# Patient Record
Sex: Female | Born: 1945 | Smoking: Former smoker
Health system: Southern US, Community
[De-identification: ages and names within clinical notes are randomized; demographics above are authoritative.]

## PROBLEM LIST (undated history)

## (undated) DIAGNOSIS — E162 Hypoglycemia, unspecified: Secondary | ICD-10-CM

## (undated) DIAGNOSIS — F419 Anxiety disorder, unspecified: Secondary | ICD-10-CM

## (undated) DIAGNOSIS — T753XXA Motion sickness, initial encounter: Secondary | ICD-10-CM

## (undated) DIAGNOSIS — K219 Gastro-esophageal reflux disease without esophagitis: Secondary | ICD-10-CM

## (undated) DIAGNOSIS — R011 Cardiac murmur, unspecified: Secondary | ICD-10-CM

## (undated) DIAGNOSIS — E119 Type 2 diabetes mellitus without complications: Secondary | ICD-10-CM

## (undated) DIAGNOSIS — I10 Essential (primary) hypertension: Secondary | ICD-10-CM

## (undated) HISTORY — PX: TUBAL LIGATION: SHX77

---

## 2015-11-13 ENCOUNTER — Emergency Department
Admission: EM | Admit: 2015-11-13 | Discharge: 2015-11-13 | Disposition: A | Discharge status: Home / Self Care | Payer: Medicare Other | Attending: Emergency Medicine | Admitting: Emergency Medicine

## 2015-11-13 ENCOUNTER — Encounter: Payer: Self-pay | Admitting: *Deleted

## 2015-11-13 DIAGNOSIS — E876 Hypokalemia: Secondary | ICD-10-CM | POA: Diagnosis not present

## 2015-11-13 DIAGNOSIS — Z87891 Personal history of nicotine dependence: Secondary | ICD-10-CM | POA: Insufficient documentation

## 2015-11-13 DIAGNOSIS — R42 Dizziness and giddiness: Secondary | ICD-10-CM | POA: Diagnosis present

## 2015-11-13 DIAGNOSIS — R002 Palpitations: Secondary | ICD-10-CM

## 2015-11-13 HISTORY — DX: Hypoglycemia, unspecified: E16.2

## 2015-11-13 LAB — COMPREHENSIVE METABOLIC PANEL
ALBUMIN: 3.7 g/dL (ref 3.5–5.0)
ALT: 14 U/L (ref 14–54)
AST: 20 U/L (ref 15–41)
Alkaline Phosphatase: 78 U/L (ref 38–126)
Anion gap: 5 (ref 5–15)
BUN: 11 mg/dL (ref 6–20)
CHLORIDE: 106 mmol/L (ref 101–111)
CO2: 28 mmol/L (ref 22–32)
Calcium: 8.6 mg/dL — ABNORMAL LOW (ref 8.9–10.3)
Creatinine, Ser: 0.57 mg/dL (ref 0.44–1.00)
GFR calc Af Amer: >60 mL/min (ref >60)
GFR calc non Af Amer: >60 mL/min (ref >60)
GLUCOSE: 151 mg/dL — AB (ref 65–99)
POTASSIUM: 3.2 mmol/L — AB (ref 3.5–5.1)
Sodium: 139 mmol/L (ref 135–145)
Total Bilirubin: 0.5 mg/dL (ref 0.3–1.2)
Total Protein: 6.4 g/dL — ABNORMAL LOW (ref 6.5–8.1)

## 2015-11-13 LAB — CBC WITH DIFFERENTIAL/PLATELET
Basophils Absolute: 0.1 10*3/uL (ref 0–0.1)
Basophils Relative: 1 %
EOS PCT: 2 %
Eosinophils Absolute: 0.2 10*3/uL (ref 0–0.7)
HEMATOCRIT: 35.5 % (ref 35.0–47.0)
Hemoglobin: 12 g/dL (ref 12.0–16.0)
LYMPHS ABS: 1 10*3/uL (ref 1.0–3.6)
LYMPHS PCT: 12 %
MCH: 30.1 pg (ref 26.0–34.0)
MCHC: 33.9 g/dL (ref 32.0–36.0)
MCV: 88.7 fL (ref 80.0–100.0)
MONO ABS: 0.7 10*3/uL (ref 0.2–0.9)
MONOS PCT: 8 %
Neutro Abs: 6 10*3/uL (ref 1.4–6.5)
Neutrophils Relative %: 77 %
PLATELETS: 235 10*3/uL (ref 150–440)
RBC: 4.01 MIL/uL (ref 3.80–5.20)
RDW: 13.2 % (ref 11.5–14.5)
WBC: 7.9 10*3/uL (ref 3.6–11.0)

## 2015-11-13 LAB — URINALYSIS COMPLETE WITH MICROSCOPIC (ARMC ONLY)
BILIRUBIN URINE: NEGATIVE
Glucose, UA: 50 mg/dL — AB
Ketones, ur: NEGATIVE mg/dL
NITRITE: NEGATIVE
PH: 6 (ref 5.0–8.0)
Protein, ur: NEGATIVE mg/dL
SPECIFIC GRAVITY, URINE: 1.01 (ref 1.005–1.030)

## 2015-11-13 LAB — TROPONIN I

## 2015-11-13 MED ORDER — POTASSIUM CHLORIDE CRYS ER 20 MEQ PO TBCR
20.0000 meq | EXTENDED_RELEASE_TABLET | Freq: Once | ORAL | Status: AC
  Administered 2015-11-13: 20 meq via ORAL
  Filled 2015-11-13: qty 1

## 2015-11-13 NOTE — Discharge Instructions (Signed)
Your blood tests overall look good, though your potassium level was only a little bit low at 3.2. We agree that you would try to eat more potassium rich foods. You're given one pill of potassium in the emergency department. Follow-up at [facility] as you have planned. Return to the emergency department if you have weakness, chest pain, or other urgent concerns.  Palpitations A palpitation is the feeling that your heartbeat is irregular. It may feel like your heart is fluttering or skipping a beat. It may also feel like your heart is beating faster than normal. This is usually not a serious problem. In some cases, you may need more medical tests. HOME CARE  Avoid:  Caffeine in coffee, tea, soft drinks, diet pills, and energy drinks.  Chocolate.  Alcohol.  Stop smoking if you smoke.  Reduce your stress and anxiety. Try:  A method that measures bodily functions so you can learn to control them (biofeedback).  Yoga.  Meditation.  Physical activity such as swimming, jogging, or walking.  Get plenty of rest and sleep. GET HELP IF:  Your fast or irregular heartbeat continues after 24 hours.  Your palpitations occur more often. GET HELP RIGHT AWAY IF:   You have chest pain.  You feel short of breath.  You have a very bad headache.  You feel dizzy or pass out (faint). MAKE SURE YOU:   Understand these instructions.  Will watch your condition.  Will get help right away if you are not doing well or get worse.   This information is not intended to replace advice given to you by your health care provider. Make sure you discuss any questions you have with your health care provider.   Document Released: 09/21/2008 Document Revised: 01/03/2015 Document Reviewed: 02/11/2012 Elsevier Interactive Patient Education [employer].

## 2015-11-13 NOTE — ED Provider Notes (Signed)
[facility] Emergency Department Provider Note  ____________________________________________  Time seen: 1125  I have reviewed the triage vital signs and the nursing notes.   HISTORY  Chief Complaint Dizziness  palpitations    HPI Alicia Jennings is a 69 y.o. female who experienced some palpitations yesterday and today. She describes this as having "skipped a beat". When this happens, she feels a little bit lightheaded and has some tingling through her body. She has had symptoms like this before and was told it was due to hypoglycemia. She was advised to eat peanut butter when this occurs. She ate peanut butter after some of the symptoms yesterday and it seemed to improve and again today. She is currently experiencing minimal symptoms, but does report that she still feels it occasionally. She reports that she has had a total of approximately 10 skipped beats through this morning.  She denies any chest pain, shortness of breath, or fever.   Past Medical History  Diagnosis Date  . Hypoglycemia     There are no active problems to display for this patient.   History reviewed. No pertinent past surgical history.  Current Outpatient Rx  Name  Route  Sig  Dispense  Refill  . naproxen sodium (ANAPROX) 220 MG tablet   Oral   Take 220 mg by mouth as needed.         . RaNITidine HCl (ACID REDUCER PO)   Oral   Take 1 tablet by mouth as needed.           Allergies Aspirin  No family history on file.  Social History Social History  Substance Use Topics  . Smoking status: Former Research scientist (life sciences)  . Smokeless tobacco: None  . Alcohol Use: Yes     Comment: occasional    Review of Systems  Constitutional: Negative for fatigue. ENT: Negative for congestion. Cardiovascular: "Skipped beats". See history of present illness. Respiratory: Negative for cough. Gastrointestinal: Negative for abdominal pain, vomiting and diarrhea. Genitourinary: Negative for  dysuria. Musculoskeletal: No myalgias or injuries. Skin: Negative for rash. Neurological: Negative for headache or focal weakness   10-point ROS otherwise negative.  ____________________________________________   PHYSICAL EXAM:  VITAL SIGNS: ED Triage Vitals  Enc Vitals Group     BP 11/13/15 1119 168/91 mmHg     Pulse Rate 11/13/15 1119 78     Resp 11/13/15 1119 18     Temp 11/13/15 1119 98 F (36.7 C)     Temp Source 11/13/15 1119 Oral     SpO2 11/13/15 1119 100 %     Weight 11/13/15 1119 135 lb (61.236 kg)     Height 11/13/15 1119 5' 1.5" (1.562 m)     Head Cir --      Peak Flow --      Pain Score --      Pain Loc --      Pain Edu? --      Excl. in [place]? --     Constitutional: Alert and oriented. Well appearing and in no distress. ENT   Head: Normocephalic and atraumatic.   Nose: No congestion/rhinnorhea.       Mouth: No erythema, no swelling   Cardiovascular: Normal rate, regular rhythm, no murmur noted Respiratory:  Normal respiratory effort, no tachypnea.    Breath sounds are clear and equal bilaterally.  Gastrointestinal: Soft and nontender. No distention.  Back: No muscle spasm, no tenderness, no CVA tenderness. Musculoskeletal: No deformity noted. Nontender with normal range of motion in  all extremities.  No noted edema. Neurologic:  Communicative. Normal appearing spontaneous movement in all 4 extremities. No gross focal neurologic deficits are appreciated.  Skin:  Skin is warm, dry. No rash noted. Psychiatric: Mood and affect are normal. Speech and behavior are normal.  ____________________________________________    LABS (pertinent positives/negatives)  Labs Reviewed  COMPREHENSIVE METABOLIC PANEL - Abnormal; Notable for the following:    Potassium 3.2 (*)    Glucose, Bld 151 (*)    Calcium 8.6 (*)    Total Protein 6.4 (*)    All other components within normal limits  URINALYSIS COMPLETEWITH MICROSCOPIC ([facility] ONLY) - Abnormal; Notable for the  following:    Color, Urine STRAW (*)    APPearance CLEAR (*)    Glucose, UA 50 (*)    Hgb urine dipstick 1+ (*)    Leukocytes, UA 1+ (*)    Bacteria, UA RARE (*)    Squamous Epithelial / LPF 0-5 (*)    All other components within normal limits  TROPONIN I  CBC WITH DIFFERENTIAL/PLATELET     ____________________________________________   EKG  ED ECG REPORT I, Zara Wendt W, the attending physician, personally viewed and interpreted this ECG.   Date: 11/13/2015  EKG Time: 1153  Rate: 76  Rhythm: Normal sinus rhythm  Axis: Normal  Intervals: Normal  ST&T Change: None noted   ____________________________________________    INITIAL IMPRESSION / ASSESSMENT AND PLAN / ED COURSE  Pertinent labs & imaging results that were available during my care of the patient were reviewed by me and considered in my medical decision making (see chart for details).  Well-appearing 69 year old female with a history of "skipped beats".  EKG looks good with no premature ventricular contractions area labs are pending.  ----------------------------------------- 3:04 PM on 11/13/2015 -----------------------------------------  Patient's labs have returned. Her potassium is 3.2. It is possible that this very slight decrease from normal could be triggering some of her symptoms. We will treat her with potassium, 20 mEq, by mouth now. I have offered the patient a prescription of potassium versus taking potassium rich foods. She prefers to eat potassium rich foods. I think this is reasonable.  We have discussed situation and follow-up with her as well as her daughter. She plans on following up at [place]. I think this is a good follow-up plan, as Dr. Stark Klein can assist her with a Holter monitor if he feels it is indicated. Currently I do not see such an indication.  Patient looks and feels well.  ____________________________________________   FINAL CLINICAL IMPRESSION(S) / ED  DIAGNOSES  Final diagnoses:  Heart palpitations  Hypokalemia      Ahmed Prima, MD 11/13/15 [phone]

## 2015-11-13 NOTE — ED Notes (Signed)
Patient states she felt dizzy, short of breath and felt her heart would skip a beat at work. Patient has a history of hypoglycemia and told EMS that she felt like she had a low blood sugar and ate peanut butter. Patient states the peanut butter did not help her symptoms.

## 2015-11-19 ENCOUNTER — Encounter: Payer: Self-pay | Admitting: Emergency Medicine

## 2015-11-19 ENCOUNTER — Emergency Department: Payer: Medicare Other

## 2015-11-19 ENCOUNTER — Emergency Department
Admission: EM | Admit: 2015-11-19 | Discharge: 2015-11-19 | Disposition: A | Discharge status: Home / Self Care | Payer: Medicare Other | Attending: Emergency Medicine | Admitting: Emergency Medicine

## 2015-11-19 DIAGNOSIS — Z87891 Personal history of nicotine dependence: Secondary | ICD-10-CM | POA: Insufficient documentation

## 2015-11-19 DIAGNOSIS — E119 Type 2 diabetes mellitus without complications: Secondary | ICD-10-CM | POA: Insufficient documentation

## 2015-11-19 DIAGNOSIS — I10 Essential (primary) hypertension: Secondary | ICD-10-CM | POA: Diagnosis not present

## 2015-11-19 DIAGNOSIS — F419 Anxiety disorder, unspecified: Secondary | ICD-10-CM | POA: Insufficient documentation

## 2015-11-19 DIAGNOSIS — R252 Cramp and spasm: Secondary | ICD-10-CM | POA: Insufficient documentation

## 2015-11-19 DIAGNOSIS — R0602 Shortness of breath: Secondary | ICD-10-CM | POA: Insufficient documentation

## 2015-11-19 HISTORY — DX: Type 2 diabetes mellitus without complications: E11.9

## 2015-11-19 HISTORY — DX: Essential (primary) hypertension: I10

## 2015-11-19 LAB — COMPREHENSIVE METABOLIC PANEL
ALBUMIN: 4.3 g/dL (ref 3.5–5.0)
ALK PHOS: 84 U/L (ref 38–126)
ALT: 14 U/L (ref 14–54)
AST: 22 U/L (ref 15–41)
Anion gap: 9 (ref 5–15)
BILIRUBIN TOTAL: 0.5 mg/dL (ref 0.3–1.2)
BUN: 26 mg/dL — AB (ref 6–20)
CALCIUM: 9.4 mg/dL (ref 8.9–10.3)
CO2: 28 mmol/L (ref 22–32)
CREATININE: 0.79 mg/dL (ref 0.44–1.00)
Chloride: 94 mmol/L — ABNORMAL LOW (ref 101–111)
GFR calc Af Amer: >60 mL/min (ref >60)
GFR calc non Af Amer: >60 mL/min (ref >60)
GLUCOSE: 199 mg/dL — AB (ref 65–99)
Potassium: 4.5 mmol/L (ref 3.5–5.1)
SODIUM: 131 mmol/L — AB (ref 135–145)
TOTAL PROTEIN: 8 g/dL (ref 6.5–8.1)

## 2015-11-19 LAB — FIBRIN DERIVATIVES D-DIMER (ARMC ONLY): Fibrin derivatives D-dimer (ARMC): 693 — ABNORMAL HIGH (ref 0–499)

## 2015-11-19 LAB — CBC WITH DIFFERENTIAL/PLATELET
BASOS PCT: 1 %
Basophils Absolute: 0.1 10*3/uL (ref 0–0.1)
EOS ABS: 0.1 10*3/uL (ref 0–0.7)
Eosinophils Relative: 1 %
HEMATOCRIT: 39.8 % (ref 35.0–47.0)
HEMOGLOBIN: 13.6 g/dL (ref 12.0–16.0)
LYMPHS ABS: 2 10*3/uL (ref 1.0–3.6)
Lymphocytes Relative: 25 %
MCH: 29.9 pg (ref 26.0–34.0)
MCHC: 34.1 g/dL (ref 32.0–36.0)
MCV: 87.7 fL (ref 80.0–100.0)
MONOS PCT: 10 %
Monocytes Absolute: 0.8 10*3/uL (ref 0.2–0.9)
NEUTROS ABS: 5.2 10*3/uL (ref 1.4–6.5)
NEUTROS PCT: 63 %
Platelets: 314 10*3/uL (ref 150–440)
RBC: 4.53 MIL/uL (ref 3.80–5.20)
RDW: 13 % (ref 11.5–14.5)
WBC: 8.3 10*3/uL (ref 3.6–11.0)

## 2015-11-19 LAB — TROPONIN I: Troponin I: <0.03 ng/mL (ref <0.031)

## 2015-11-19 MED ORDER — SODIUM CHLORIDE 0.9 % IV BOLUS (SEPSIS)
500.0000 mL | Freq: Once | INTRAVENOUS | Status: AC
  Administered 2015-11-19: 500 mL via INTRAVENOUS

## 2015-11-19 MED ORDER — IOHEXOL 350 MG/ML SOLN
100.0000 mL | Freq: Once | INTRAVENOUS | Status: AC | PRN
Start: 2015-11-19 — End: 2015-11-19
  Administered 2015-11-19: 100 mL via INTRAVENOUS

## 2015-11-19 MED ORDER — LORAZEPAM 2 MG/ML IJ SOLN
1.0000 mg | Freq: Once | INTRAMUSCULAR | Status: AC
  Administered 2015-11-19: 1 mg via INTRAVENOUS

## 2015-11-19 MED ORDER — LORAZEPAM 1 MG PO TABS: 1.0000 mg | ORAL_TABLET | Freq: Three times a day (TID) | ORAL | Status: AC | PRN

## 2015-11-19 NOTE — ED Notes (Signed)
Pt reports that she has cramping in her hands, feet, and calves that started today and that is why she called EMS. She states that when she tries to walk, cramps get worse. SOB comes and goes. Pt was seen by PCP and heart doc yesterday. She is scheduled for stress test and echocardiogram on Dec 1. She began taking lisinopril on Sunday after going to [facility] because her BP was high ("170 something").

## 2015-11-19 NOTE — Discharge Instructions (Signed)

## 2015-11-19 NOTE — ED Notes (Signed)
Pt states "i feel sleepy, is it normal for me to feel sleepy?" explanation of ativan provided to pt. Pt verbalizes understanding. Pt denies pain currently. Call bell provided to pt. resps unlabored.

## 2015-11-19 NOTE — ED Provider Notes (Signed)
Time Seen: Approximately 1940 I have reviewed the triage notes  Chief Complaint: Shortness of Breath   History of Present Illness: Alicia Jennings is a 69 y.o. female who has been recently evaluated for her shortness of breath and some other nonspecific complaints. Patient tonight presents with some cramping in her hands, feet, calves. Patient was transported by EMS and also states that she does still have some shortness of breath that "" comes and goes "". She denies any chest pain or calf swelling. She states she was breathing fast and felt anxious when the symptoms occurred. She describes diffuse cramping in both upper and lower extremities. She denies any fever or chills or cough. She states that the cramping in her upper extremity has improved though she still has some mild cramping in both lower extremities.   Past Medical History  Diagnosis Date  . Hypoglycemia   . Hypertension   . Diabetes mellitus without complication ([place])     There are no active problems to display for this patient.   History reviewed. No pertinent past surgical history.  History reviewed. No pertinent past surgical history.  Current Outpatient Rx  Name  Route  Sig  Dispense  Refill  . LORazepam (ATIVAN) 1 MG tablet   Oral   Take 1 tablet (1 mg total) by mouth every 8 (eight) hours as needed for anxiety.   21 tablet   0   . naproxen sodium (ANAPROX) 220 MG tablet   Oral   Take 220 mg by mouth as needed.         . RaNITidine HCl (ACID REDUCER PO)   Oral   Take 1 tablet by mouth as needed.           Allergies:  Aspirin  Family History: History reviewed. No pertinent family history.  Social History: Social History  Substance Use Topics  . Smoking status: Former Research scientist (life sciences)  . Smokeless tobacco: Never Used  . Alcohol Use: No     Comment: occasional     Review of Systems:   10 point review of systems was performed and was otherwise negative:  Constitutional: No fever Eyes: No visual  disturbances ENT: No sore throat, ear pain Cardiac: No chest pain Respiratory: Patient describes some shortness of breath earlier which seems to improved Abdomen: No abdominal pain, no vomiting, No diarrhea Endocrine: No weight loss, No night sweats Extremities: No peripheral edema, cyanosis Skin: No rashes, easy bruising Neurologic: No focal weakness, trouble with speech or swollowing Urologic: No dysuria, Hematuria, or urinary frequency   Physical Exam:  ED Triage Vitals  Enc Vitals Group     BP 11/19/15 1839 162/92 mmHg     Pulse Rate 11/19/15 1839 93     Resp 11/19/15 2000 17     Temp 11/19/15 1839 97.7 F (36.5 C)     Temp Source 11/19/15 1839 Oral     SpO2 11/19/15 1839 100 %     Weight 11/19/15 1839 130 lb (58.968 kg)     Height 11/19/15 1839 5\' 1"  (1.549 m)     Head Cir --      Peak Flow --      Pain Score 11/19/15 1848 10     Pain Loc --      Pain Edu? --      Excl. in [place]? --     General: Awake , Alert , and Oriented times 3; GCS 15 Head: Normal cephalic , atraumatic Eyes: Pupils equal , round, reactive  to light Nose/Throat: No nasal drainage, patent upper airway without erythema or exudate.  Neck: Supple, Full range of motion, No anterior adenopathy or palpable thyroid masses Lungs: Clear to ascultation without wheezes , rhonchi, or rales Heart: Regular rate, regular rhythm without murmurs , gallops , or rubs Abdomen: Soft, non tender without rebound, guarding , or rigidity; bowel sounds positive and symmetric in all 4 quadrants. No organomegaly .        Extremities: 2 plus symmetric pulses. No edema, clubbing or cyanosis Neurologic: normal ambulation, Motor symmetric without deficits, sensory intact Skin: warm, dry, no rashes   Labs:   All laboratory work was reviewed including any pertinent negatives or positives listed below:  Labs Reviewed  COMPREHENSIVE METABOLIC PANEL - Abnormal; Notable for the following:    Sodium 131 (*)    Chloride 94 (*)     Glucose, Bld 199 (*)    BUN 26 (*)    All other components within normal limits  FIBRIN DERIVATIVES D-DIMER ([facility] ONLY) - Abnormal; Notable for the following:    Fibrin derivatives D-dimer [employer]) 693 (*)    All other components within normal limits  CBC WITH DIFFERENTIAL/PLATELET  TROPONIN I   D-dimer test is borderline positive  EKG:   I, Daymon Larsen, the attending physician, personally viewed and interpreted this ECG.  Date: 11/19/2015 EKG Time: 1153 Rate: 76 Rhythm: normal sinus rhythm QRS Axis: normal Intervals: normal ST/T Wave abnormalities: normal Conduction Disutrbances: none Narrative Interpretation: unremarkable No acute ischemic changes  EXAM: CT ANGIOGRAPHY CHEST WITH CONTRAST  TECHNIQUE: Multidetector CT imaging of the chest was performed using the standard protocol during bolus administration of intravenous contrast. Multiplanar CT image reconstructions and MIPs were obtained to evaluate the vascular anatomy.  CONTRAST: 167mL OMNIPAQUE IOHEXOL 350 MG/ML SOLN  COMPARISON: None.  FINDINGS: There is no demonstrable pulmonary embolus. There is no thoracic aortic aneurysm or dissection. There are scattered foci of atherosclerotic calcification in the aorta. The visualized great vessels appear unremarkable.  There is slight bibasilar atelectatic change. No lung edema or consolidation. On axial slice 26 series 6, there is a 2 mm nodular opacity in the anterior segment of the right upper lobe. There is a degree of lower lobe bronchiectatic change bilaterally.  There is a 7 x 6 mm nodular opacity in the lower pole left lobe of the thyroid containing a single benign-appearing calcification.  There is no appreciable thoracic adenopathy. The pericardium is not thickened. There is rather minimal coronary artery calcification.  There is a degree of hepatic steatosis. Visualized upper abdominal structures otherwise are unremarkable.  There is degenerative  change in the thoracic spine. There are no blastic or lytic bone lesions.  Review of the MIP images confirms the above findings.  IMPRESSION: No demonstrable pulmonary embolus.  No edema or consolidation. There is lower lobe bronchiectatic change bilaterally. There is a 2 mm nodular opacity in the anterior segment right upper lobe. Followup of this nodular opacity should be based on Fleischner Society guidelines. If the patient is at high risk for bronchogenic carcinoma, follow-up chest CT at 1year is recommended. If the patient is at low risk, no follow-up is needed. This recommendation follows the consensus statement: Guidelines for Management of Small Pulmonary Nodules Detected on CT Scans: A Statement from the [facility] as published in Radiology 2005; 237:395-400.  No appreciable thoracic adenopathy. Subcentimeter nodule in left lobe of thyroid.  Hepatic steatosis.  Small amount of coronary artery calcification noted.  Radiology:  I personally reviewed the radiologic studies    ED Course: Patient's stay here was uneventful and she had symptomatic improvement after a dose of IV Ativan. Her shortness of breath and some of her other symptoms has been evaluated here with no significant findings. I felt given her description and some of her history of palpitations etc. this may be all anxiety syndrome. Was prescribed Ativan on an outpatient basis though instructed to follow up with her primary physician and/or cardiologist for further outpatient assessment.**    Assessment:  Anxiety syndrome   Final Clinical Impression:   Final diagnoses:  Shortness of breath     Plan:  Outpatient management Patient was advised to return immediately if condition worsens. Patient was advised to follow up with her primary care physician or other specialized physicians involved and in their current assessment.             Daymon Larsen, MD 11/19/15 [phone]

## 2015-11-19 NOTE — ED Notes (Signed)
Pt assisted up to commode for urine void. Pt assisted back to bed.

## 2015-11-19 NOTE — ED Notes (Signed)
Pt sleeping after update.

## 2015-11-19 NOTE — ED Notes (Signed)
md in to review results with pt and family.

## 2015-11-19 NOTE — ED Notes (Signed)
Pt shaking. Updated her on care plan.

## 2015-11-19 NOTE — ED Notes (Signed)
Pt sleeping. 

## 2015-11-19 NOTE — ED Notes (Signed)
Pt and family updated on progress of ct results. Pt verbalizes understanding.

## 2015-11-19 NOTE — ED Notes (Addendum)
Pt from home with sob, shakiness, cramping. Pt recently diagnosed with diabetes and high cholesterol (received call from her MD office today to inform her of that.). Pt alert & oriented with warm, dry skin. Pt seen here last Thursday for "heart skipping" and then Sunday at [facility] for SOB.

## 2015-11-25 ENCOUNTER — Other Ambulatory Visit: Payer: Self-pay | Admitting: Internal Medicine

## 2015-11-25 DIAGNOSIS — Z1231 Encounter for screening mammogram for malignant neoplasm of breast: Secondary | ICD-10-CM

## 2015-12-03 ENCOUNTER — Ambulatory Visit
Admission: RE | Admit: 2015-12-03 | Discharge: 2015-12-03 | Disposition: A | Discharge status: Home / Self Care | Payer: Medicare Other | Source: Ambulatory Visit | Attending: Internal Medicine | Admitting: Internal Medicine

## 2015-12-03 ENCOUNTER — Other Ambulatory Visit: Payer: Self-pay | Admitting: Internal Medicine

## 2015-12-03 DIAGNOSIS — Z1231 Encounter for screening mammogram for malignant neoplasm of breast: Secondary | ICD-10-CM | POA: Diagnosis not present

## 2016-03-25 ENCOUNTER — Encounter: Payer: Self-pay | Admitting: *Deleted

## 2016-03-26 ENCOUNTER — Encounter: Admission: RE | Payer: Self-pay | Source: Ambulatory Visit

## 2016-03-26 ENCOUNTER — Ambulatory Visit: Admission: RE | Admit: 2016-03-26 | Payer: Medicare Other | Source: Ambulatory Visit | Admitting: Gastroenterology

## 2016-03-26 SURGERY — COLONOSCOPY WITH PROPOFOL
Anesthesia: General

## 2016-09-20 ENCOUNTER — Other Ambulatory Visit: Payer: Self-pay | Admitting: Internal Medicine

## 2016-09-20 DIAGNOSIS — E1022 Type 1 diabetes mellitus with diabetic chronic kidney disease: Secondary | ICD-10-CM

## 2016-09-20 DIAGNOSIS — I158 Other secondary hypertension: Secondary | ICD-10-CM

## 2016-09-24 ENCOUNTER — Ambulatory Visit: Payer: Medicare Other

## 2016-11-18 IMAGING — CT CT ANGIO CHEST
1 of 2 series · 18 of 30 positions shown · IV contrast (APPLIED)
Comparison: None.

CLINICAL DATA: Shortness of Breath

EXAM:
CT ANGIOGRAPHY CHEST WITH CONTRAST
TECHNIQUE: Multidetector CT imaging of the chest was performed using the
standard protocol during bolus administration of intravenous
contrast. Multiplanar CT image reconstructions and MIPs were
obtained to evaluate the vascular anatomy.
CONTRAST:  100mL OMNIPAQUE IOHEXOL 350 MG/ML SOLN

[Series 5: pe 1.0 thins · axial · 0.62mm/px · z∈[-26,+202]mm · 18 of 259 slices shown]
[im 15/259  lung]
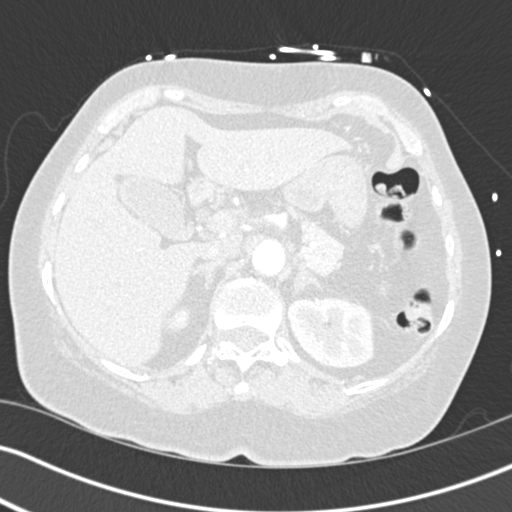
[im 29/259  mediastinal]
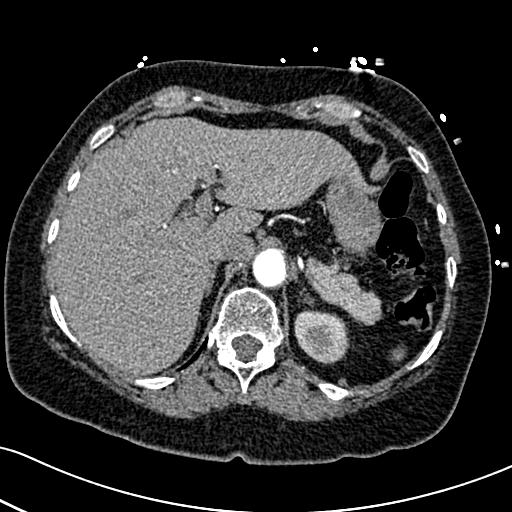
[im 44/259  lung]
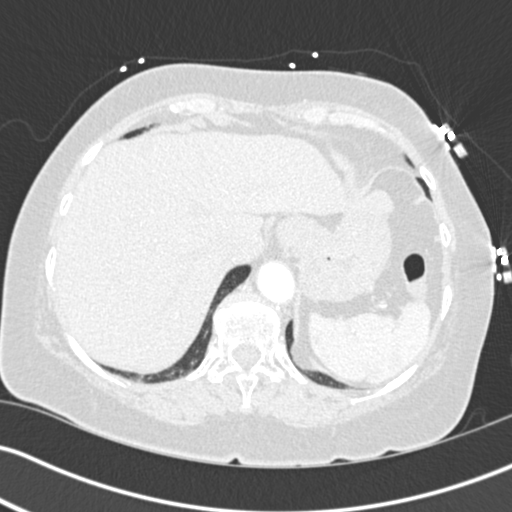
[im 58/259  mediastinal]
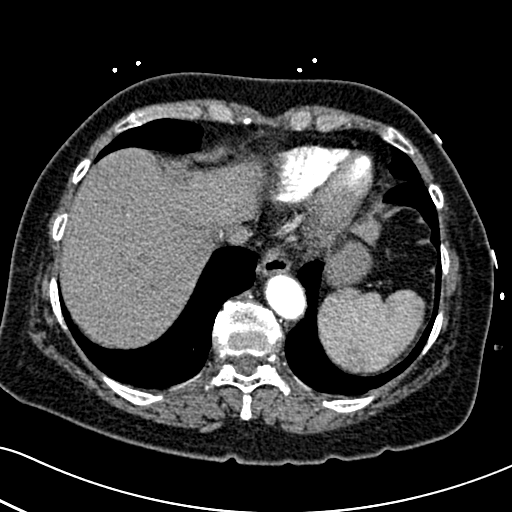
[im 72/259  lung]
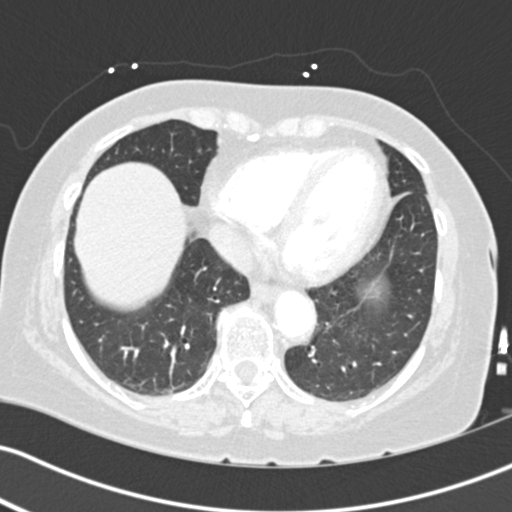
[im 87/259  mediastinal]
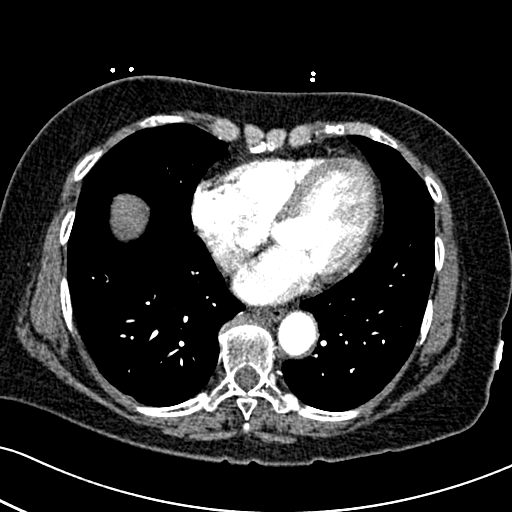
[im 101/259  lung]
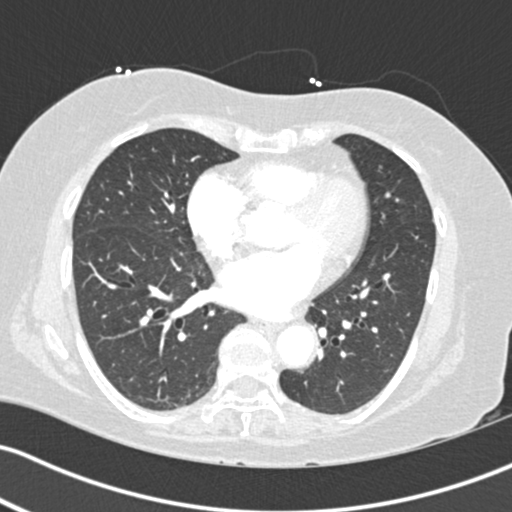
[im 115/259  mediastinal]
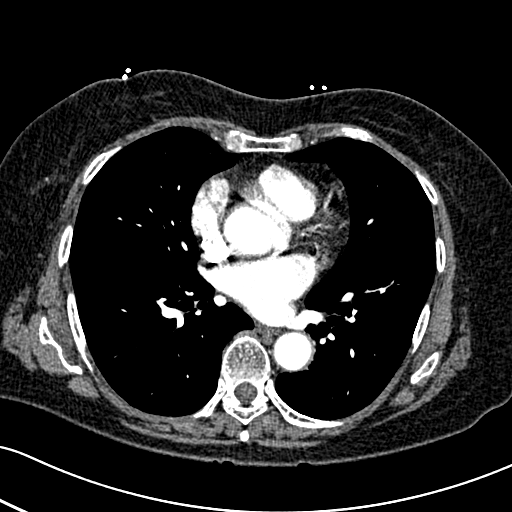
[im 120/259  lung]
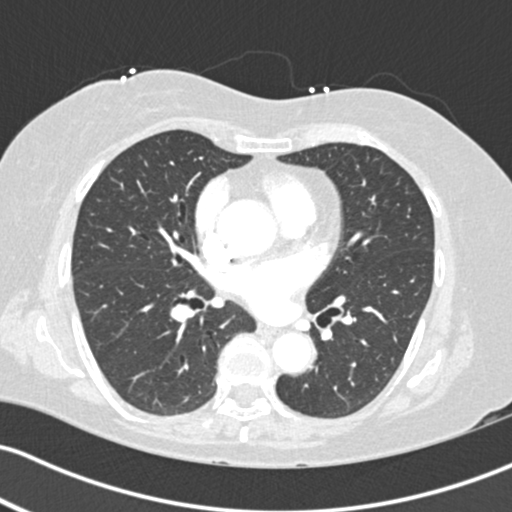
[im 130/259  mediastinal]
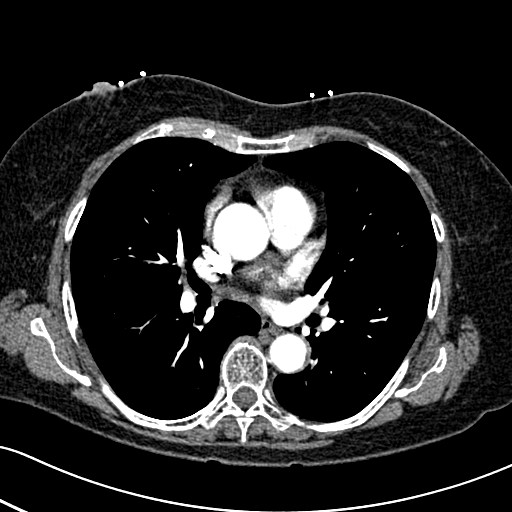
[im 144/259  lung]
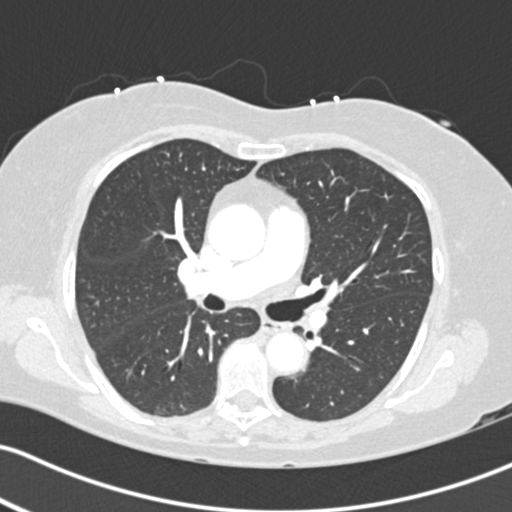
[im 158/259  mediastinal]
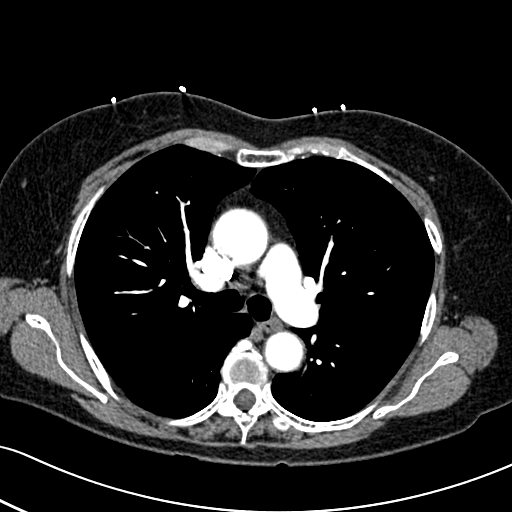
[im 173/259  lung]
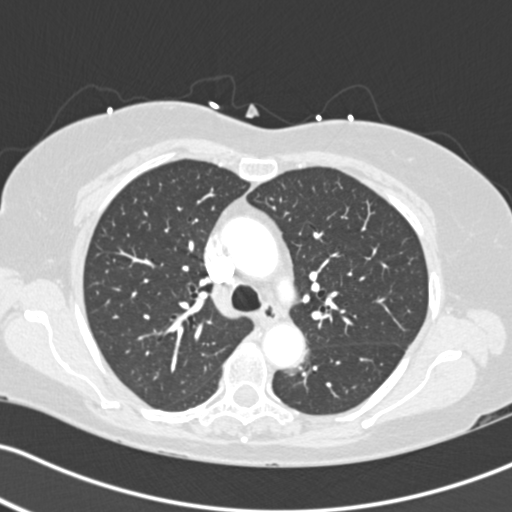
[im 187/259  mediastinal]
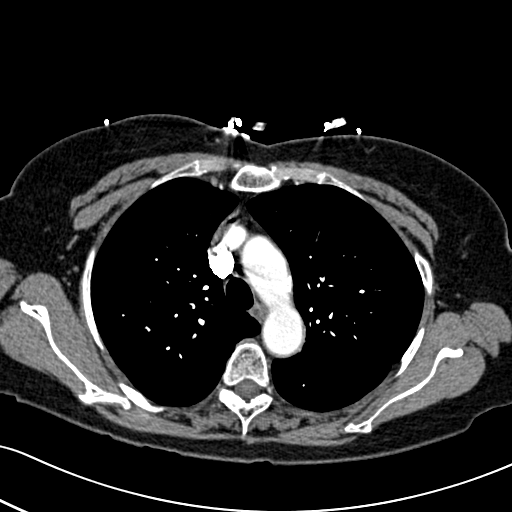
[im 201/259  lung]
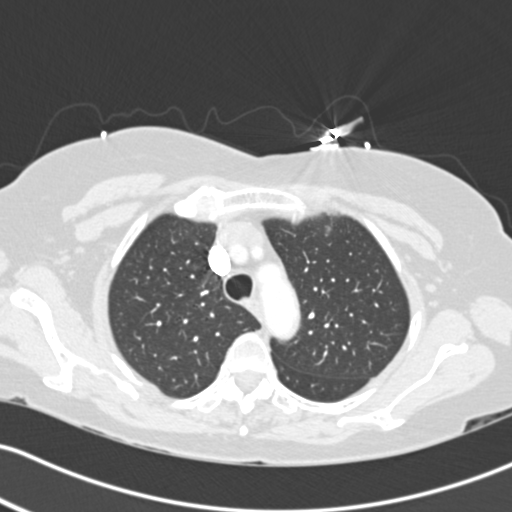
[im 216/259  mediastinal]
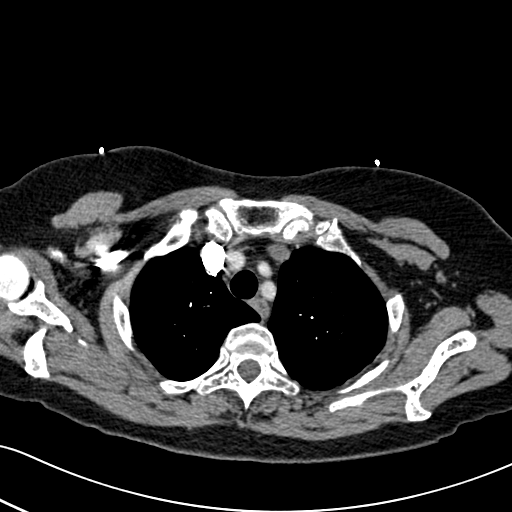
[im 230/259  lung]
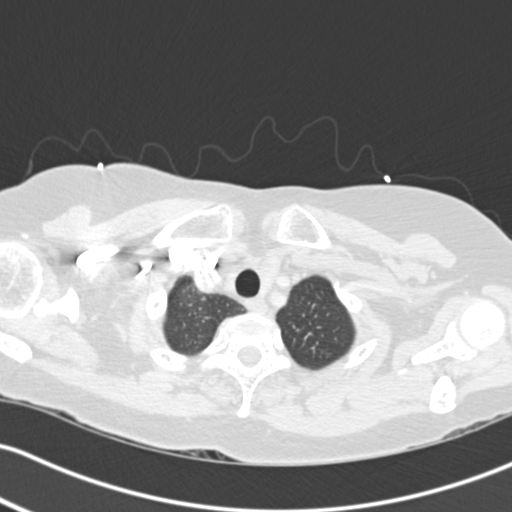
[im 244/259  mediastinal]
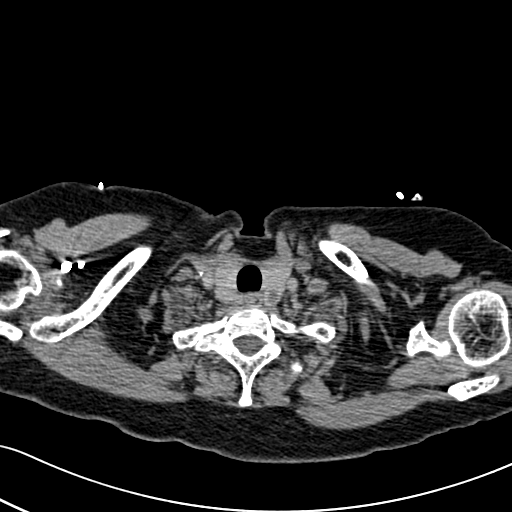

[18 of 30 positions shown; findings below may reference images not displayed]

FINDINGS: There is no demonstrable pulmonary embolus. There is no thoracic
aortic aneurysm or dissection. There are scattered foci of
atherosclerotic calcification in the aorta. The visualized great
vessels appear unremarkable.

There is slight bibasilar atelectatic change. No lung edema or
consolidation. On axial slice 26 series 6, there is a 2 mm nodular
opacity in the anterior segment of the right upper lobe. There is a
degree of lower lobe bronchiectatic change bilaterally.

There is a 7 x 6 mm nodular opacity in the lower pole left lobe of
the thyroid containing a single benign-appearing calcification.

There is no appreciable thoracic adenopathy. The pericardium is not
thickened. There is rather minimal coronary artery calcification.

There is a degree of hepatic steatosis. Visualized upper abdominal
structures otherwise are unremarkable.

There is degenerative change in the thoracic spine. There are no
blastic or lytic bone lesions.

Review of the MIP images confirms the above findings.
IMPRESSION: No demonstrable pulmonary embolus.

No edema or consolidation. There is lower lobe bronchiectatic change
bilaterally. There is a 2 mm nodular opacity in the anterior segment
right upper lobe. Followup of this nodular opacity should be based
on [HOSPITAL] guidelines. If the patient is at high risk for
bronchogenic carcinoma, follow-up chest CT at 5year is recommended.
If the patient is at low risk, no follow-up is needed. This
recommendation follows the consensus statement: Guidelines for
Management of Small Pulmonary Nodules Detected on CT Scans: A
Statement from the [HOSPITAL] as published in Radiology
8662; [DATE].

No appreciable thoracic adenopathy. Subcentimeter nodule in left
lobe of thyroid.

Hepatic steatosis.

Small amount of coronary artery calcification noted.

## 2016-11-26 ENCOUNTER — Other Ambulatory Visit: Payer: Self-pay | Admitting: Internal Medicine

## 2016-11-26 DIAGNOSIS — Z1231 Encounter for screening mammogram for malignant neoplasm of breast: Secondary | ICD-10-CM

## 2017-01-10 ENCOUNTER — Ambulatory Visit
Admission: RE | Admit: 2017-01-10 | Discharge: 2017-01-10 | Disposition: A | Discharge status: Home / Self Care | Payer: Medicare Other | Source: Ambulatory Visit | Attending: Internal Medicine | Admitting: Internal Medicine

## 2017-01-10 DIAGNOSIS — Z1231 Encounter for screening mammogram for malignant neoplasm of breast: Secondary | ICD-10-CM | POA: Insufficient documentation

## 2017-02-18 ENCOUNTER — Other Ambulatory Visit: Payer: Self-pay

## 2017-02-18 ENCOUNTER — Telehealth: Payer: Self-pay

## 2017-02-18 NOTE — Telephone Encounter (Signed)
Gastroenterology Pre-Procedure Review  Request Date:  Requesting Physician: Dr.   PATIENT REVIEW QUESTIONS: The patient responded to the following health history questions as indicated:    1. Are you having any GI issues? no 2. Do you have a personal history of Polyps? no 3. Do you have a family history of Colon Cancer or Polyps? no 4. Diabetes Mellitus? Yes, Type 2 5. Joint replacements in the past 12 months?no 6. Major health problems in the past 3 months?no 7. Any artificial heart valves, MVP, or defibrillator?no    MEDICATIONS & ALLERGIES:    Patient reports the following regarding taking any anticoagulation/antiplatelet therapy:   Plavix, Coumadin, Eliquis, Xarelto, Lovenox, Pradaxa, Brilinta, or Effient? no Aspirin? no  Patient confirms/reports the following medications:  Current Outpatient Prescriptions  Medication Sig Dispense Refill  . atorvastatin (LIPITOR) 40 MG tablet Take 40 mg by mouth daily.    Marland Kitchen lisinopril (PRINIVIL,ZESTRIL) 10 MG tablet Take 10 mg by mouth daily.    . metFORMIN (GLUCOPHAGE) 500 MG tablet Take 500 mg by mouth 2 (two) times daily with a meal.    . naproxen sodium (ANAPROX) 220 MG tablet Take 220 mg by mouth as needed.    . RaNITidine HCl (ACID REDUCER PO) Take 1 tablet by mouth as needed.     No current facility-administered medications for this visit.     Patient confirms/reports the following allergies:  Allergies  Allergen Reactions  . Aspirin     No orders of the defined types were placed in this encounter.   AUTHORIZATION INFORMATION Primary Insurance: 1D#: Group #:  Secondary Insurance: 1D#: Group #:  SCHEDULE INFORMATION: Date: 04/21/17 Time: Location: [place]

## 2017-05-09 ENCOUNTER — Encounter: Payer: Self-pay | Admitting: *Deleted

## 2017-05-13 NOTE — Discharge Instructions (Signed)

## 2017-05-16 ENCOUNTER — Ambulatory Visit: Payer: Medicare Other | Admitting: Anesthesiology

## 2017-05-16 ENCOUNTER — Encounter
Admission: RE | Disposition: A | Discharge status: Home / Self Care | Payer: Self-pay | Source: Ambulatory Visit | Attending: Gastroenterology

## 2017-05-16 ENCOUNTER — Ambulatory Visit
Admission: RE | Admit: 2017-05-16 | Discharge: 2017-05-16 | Disposition: A | Discharge status: Home / Self Care | Payer: Medicare Other | Source: Ambulatory Visit | Attending: Gastroenterology | Admitting: Gastroenterology

## 2017-05-16 DIAGNOSIS — Z87891 Personal history of nicotine dependence: Secondary | ICD-10-CM | POA: Insufficient documentation

## 2017-05-16 DIAGNOSIS — K5730 Diverticulosis of large intestine without perforation or abscess without bleeding: Secondary | ICD-10-CM | POA: Diagnosis not present

## 2017-05-16 DIAGNOSIS — Z7984 Long term (current) use of oral hypoglycemic drugs: Secondary | ICD-10-CM | POA: Insufficient documentation

## 2017-05-16 DIAGNOSIS — K219 Gastro-esophageal reflux disease without esophagitis: Secondary | ICD-10-CM | POA: Insufficient documentation

## 2017-05-16 DIAGNOSIS — F419 Anxiety disorder, unspecified: Secondary | ICD-10-CM | POA: Insufficient documentation

## 2017-05-16 DIAGNOSIS — K648 Other hemorrhoids: Secondary | ICD-10-CM | POA: Diagnosis not present

## 2017-05-16 DIAGNOSIS — E119 Type 2 diabetes mellitus without complications: Secondary | ICD-10-CM | POA: Diagnosis not present

## 2017-05-16 DIAGNOSIS — D125 Benign neoplasm of sigmoid colon: Secondary | ICD-10-CM | POA: Diagnosis not present

## 2017-05-16 DIAGNOSIS — Z1211 Encounter for screening for malignant neoplasm of colon: Secondary | ICD-10-CM | POA: Insufficient documentation

## 2017-05-16 DIAGNOSIS — Z79899 Other long term (current) drug therapy: Secondary | ICD-10-CM | POA: Insufficient documentation

## 2017-05-16 DIAGNOSIS — I10 Essential (primary) hypertension: Secondary | ICD-10-CM | POA: Diagnosis not present

## 2017-05-16 DIAGNOSIS — R195 Other fecal abnormalities: Secondary | ICD-10-CM | POA: Diagnosis not present

## 2017-05-16 DIAGNOSIS — K635 Polyp of colon: Secondary | ICD-10-CM

## 2017-05-16 HISTORY — DX: Motion sickness, initial encounter: T75.3XXA

## 2017-05-16 HISTORY — PX: POLYPECTOMY: SHX5525

## 2017-05-16 HISTORY — DX: Gastro-esophageal reflux disease without esophagitis: K21.9

## 2017-05-16 HISTORY — PX: COLONOSCOPY WITH PROPOFOL: SHX5780

## 2017-05-16 HISTORY — DX: Cardiac murmur, unspecified: R01.1

## 2017-05-16 HISTORY — DX: Anxiety disorder, unspecified: F41.9

## 2017-05-16 LAB — HM COLONOSCOPY

## 2017-05-16 LAB — GLUCOSE, CAPILLARY
GLUCOSE-CAPILLARY: 104 mg/dL — AB (ref 65–99)
Glucose-Capillary: 102 mg/dL — ABNORMAL HIGH (ref 65–99)

## 2017-05-16 SURGERY — COLONOSCOPY WITH PROPOFOL
Anesthesia: Monitor Anesthesia Care | Wound class: Contaminated

## 2017-05-16 MED ORDER — PROPOFOL 10 MG/ML IV BOLUS
INTRAVENOUS | Status: DC | PRN
  Administered 2017-05-16: 50 mg via INTRAVENOUS
  Administered 2017-05-16: 100 mg via INTRAVENOUS
  Administered 2017-05-16 (×4): 50 mg via INTRAVENOUS

## 2017-05-16 MED ORDER — LIDOCAINE HCL (CARDIAC) 20 MG/ML IV SOLN
INTRAVENOUS | Status: DC | PRN
  Administered 2017-05-16: 50 mg via INTRAVENOUS

## 2017-05-16 MED ORDER — LACTATED RINGERS IV SOLN
1000.0000 mL | INTRAVENOUS | Status: DC
  Administered 2017-05-16: 07:00:00 via INTRAVENOUS

## 2017-05-16 SURGICAL SUPPLY — 23 items

## 2017-05-16 NOTE — Transfer of Care (Signed)
Immediate Anesthesia Transfer of Care Note  Patient: Alicia Jennings  Procedure(s) Performed: Procedure(s) with comments: COLONOSCOPY WITH PROPOFOL (N/A) - Diabetic - oral meds POLYPECTOMY (N/A)  Patient Location: PACU  Anesthesia Type: MAC  Level of Consciousness: awake, alert  and patient cooperative  Airway and Oxygen Therapy: Patient Spontanous Breathing and Patient connected to supplemental oxygen  Post-op Assessment: Post-op Vital signs reviewed, Patient's Cardiovascular Status Stable, Respiratory Function Stable, Patent Airway and No signs of Nausea or vomiting  Post-op Vital Signs: Reviewed and stable  Complications: No apparent anesthesia complications

## 2017-05-16 NOTE — Anesthesia Procedure Notes (Signed)
Procedure Name: MAC Performed by: Rick Carruthers Pre-anesthesia Checklist: Patient identified, Emergency Drugs available, Suction available, Timeout performed and Patient being monitored Patient Re-evaluated:Patient Re-evaluated prior to inductionOxygen Delivery Method: Nasal cannula Placement Confirmation: positive ETCO2     

## 2017-05-16 NOTE — H&P (Signed)
   Lucilla Lame, MD [address] [place], [place] [address] Phone:[phone] Fax : [phone]  Primary Care Physician:  Patient, No Pcp Per Primary Gastroenterologist:  Dr. Allen Norris  Pre-Procedure History & Physical: HPI:  Annaly Skop is a 71 y.o. female is here for an colonoscopy.   Past Medical History:  Diagnosis Date  . Anxiety   . Diabetes mellitus without complication ([place])   . GERD (gastroesophageal reflux disease)   . Heart murmur   . Hypertension   . Hypoglycemia   . Motion sickness    boats    Past Surgical History:  Procedure Laterality Date  . TUBAL LIGATION      Prior to Admission medications   Medication Sig Start Date End Date Taking? Authorizing Provider  amLODipine (NORVASC) 5 MG tablet Take 5 mg by mouth daily.   Yes [provider]  atorvastatin (LIPITOR) 40 MG tablet Take 40 mg by mouth daily.   Yes [provider]  citalopram (CELEXA) 20 MG tablet Take 20 mg by mouth daily.   Yes [provider]  lisinopril (PRINIVIL,ZESTRIL) 10 MG tablet Take 10 mg by mouth daily.   Yes [provider]  metFORMIN (GLUCOPHAGE) 500 MG tablet Take 500 mg by mouth 2 (two) times daily with a meal.   Yes [provider]  RaNITidine HCl (ACID REDUCER PO) Take 1 tablet by mouth as needed.    [provider]    Allergies as of 02/18/2017 - Review Complete 03/25/2016  Allergen Reaction Noted  . Aspirin  11/13/2015    History reviewed. No pertinent family history.  Social History   Social History  . Marital status: Divorced    Spouse name: N/A  . Number of children: N/A  . Years of education: N/A   Occupational History  . Not on file.   Social History Main Topics  . Smoking status: Former Smoker    Quit date: 2015  . Smokeless tobacco: Never Used  . Alcohol use No     Comment: occasional  . Drug use: Unknown  . Sexual activity: Not on file   Other Topics Concern  . Not on file   Social  History Narrative  . No narrative on file    Review of Systems: See HPI, otherwise negative ROS  Physical Exam: BP (!) 151/90   Pulse 85   Temp 97.9 F (36.6 C) (Tympanic)   Resp 15   Ht 5\' 1"  (1.549 m)   Wt 130 lb (59 kg)   SpO2 100%   BMI 24.56 kg/m  General:   Alert,  pleasant and cooperative in NAD Head:  Normocephalic and atraumatic. Neck:  Supple; no masses or thyromegaly. Lungs:  Clear throughout to auscultation.    Heart:  Regular rate and rhythm. Abdomen:  Soft, nontender and nondistended. Normal bowel sounds, without guarding, and without rebound.   Neurologic:  Alert and  oriented x4;  grossly normal neurologically.  Impression/Plan: Fadumo Heng is here for an colonoscopy to be performed for positive cologurd  Risks, benefits, limitations, and alternatives regarding  colonoscopy have been reviewed with the patient.  Questions have been answered.  All parties agreeable.   Lucilla Lame, MD  05/16/2017, 8:00 AM

## 2017-05-16 NOTE — Op Note (Signed)
[facility] Patient Name: Alicia Jennings Procedure Date: 05/16/2017 8:40 AM MRN: [id number] Account #: [id number] Date of Birth: [birth year] Admit Type: Outpatient Age: 71 Room: [facility] OR ROOM 01 Gender: Female Note Status: Finalized Procedure:            Colonoscopy Indications:          Positive Cologuard test Providers:            Lucilla Lame MD, MD Referring MD:         Nino Glow Mclean-Scocuzza MD, MD (Referring MD) Medicines:            Propofol per Anesthesia Complications:        No immediate complications. Procedure:            Pre-Anesthesia Assessment:                       - Prior to the procedure, a History and Physical was                        performed, and patient medications and allergies were                        reviewed. The patient's tolerance of previous                        anesthesia was also reviewed. The risks and benefits of                        the procedure and the sedation options and risks were                        discussed with the patient. All questions were                        answered, and informed consent was obtained. Prior                        Anticoagulants: The patient has taken no previous                        anticoagulant or antiplatelet agents. ASA Grade                        Assessment: II - A patient with mild systemic disease.                        After reviewing the risks and benefits, the patient was                        deemed in satisfactory condition to undergo the                        procedure.                       After obtaining informed consent, the colonoscope was                        passed under direct vision. Throughout the procedure,  the patient's blood pressure, pulse, and oxygen                        saturations were monitored continuously. The [place] [id number]) was introduced through the                anus and advanced to the the cecum, identified by                        appendiceal orifice and ileocecal valve. The                        colonoscopy was performed without difficulty. The                        patient tolerated the procedure well. The quality of                        the bowel preparation was excellent. Findings:      The perianal and digital rectal examinations were normal.      Two sessile polyps were found in the sigmoid colon. The polyps were 2 to       3 mm in size. These polyps were removed with a cold biopsy forceps.       Resection and retrieval were complete.      Non-bleeding internal hemorrhoids were found during retroflexion. The       hemorrhoids were Grade II (internal hemorrhoids that prolapse but reduce       spontaneously).      Many small-mouthed diverticula were found in the sigmoid colon. Impression:           - Two 2 to 3 mm polyps in the sigmoid colon, removed                        with a cold biopsy forceps. Resected and retrieved.                       - Non-bleeding internal hemorrhoids.                       - Diverticulosis in the sigmoid colon. Recommendation:       - Discharge patient to home.                       - Resume previous diet.                       - Continue present medications.                       - Await pathology results.                       - Repeat colonoscopy in 5 years if polyp adenoma and 10                        years if hyperplastic Procedure Code(s):    --- Professional ---  45380, Colonoscopy, flexible; with biopsy, single or                        multiple Diagnosis Code(s):    --- Professional ---                       R19.5, Other fecal abnormalities                       D12.5, Benign neoplasm of sigmoid colon CPT copyright 2016 American Medical Association. All rights reserved. The codes documented in this report are preliminary and upon coder review may  be revised  to meet current compliance requirements. Lucilla Lame MD, MD 05/16/2017 9:10:48 AM This report has been signed electronically. Number of Addenda: 0 Note Initiated On: 05/16/2017 8:40 AM Scope Withdrawal Time: 0 hours 10 minutes 10 seconds  Total Procedure Duration: 0 hours 13 minutes 39 seconds       [facility]

## 2017-05-16 NOTE — Anesthesia Postprocedure Evaluation (Signed)
Anesthesia Post Note  Patient: Alicia Jennings  Procedure(s) Performed: Procedure(s) (LRB): COLONOSCOPY WITH PROPOFOL (N/A) POLYPECTOMY (N/A)  Patient location during evaluation: PACU Anesthesia Type: MAC Level of consciousness: awake Pain management: pain level controlled Vital Signs Assessment: post-procedure vital signs reviewed and stable Respiratory status: spontaneous breathing Cardiovascular status: blood pressure returned to baseline Postop Assessment: no headache Anesthetic complications: no    Lavonna Monarch

## 2017-05-16 NOTE — Anesthesia Preprocedure Evaluation (Addendum)
Anesthesia Evaluation  Patient identified by MRN, date of birth, ID band Patient awake    Reviewed: Allergy & Precautions, NPO status , Patient's Chart, lab work & pertinent test results, reviewed documented beta blocker date and time   Airway Mallampati: II  TM Distance: >3 FB Neck ROM: Full    Dental no notable dental hx.    Pulmonary former smoker,    Pulmonary exam normal breath sounds clear to auscultation       Cardiovascular hypertension, Normal cardiovascular exam Rhythm:Regular Rate:Normal     Neuro/Psych Anxiety    GI/Hepatic GERD  ,  Endo/Other  diabetes  Renal/GU   negative genitourinary   Musculoskeletal negative musculoskeletal ROS (+)   Abdominal Normal abdominal exam  (+)  Abdomen: soft.    Peds  Hematology negative hematology ROS (+)   Anesthesia Other Findings   Reproductive/Obstetrics negative OB ROS                            Anesthesia Physical Anesthesia Plan  ASA: II  Anesthesia Plan: MAC   Post-op Pain Management:    Induction:   Airway Management Planned: Mask  Additional Equipment: None  Intra-op Plan:   Post-operative Plan:   Informed Consent: I have reviewed the patients History and Physical, chart, labs and discussed the procedure including the risks, benefits and alternatives for the proposed anesthesia with the patient or authorized representative who has indicated his/her understanding and acceptance.     Plan Discussed with: CRNA, Anesthesiologist and Surgeon  Anesthesia Plan Comments:         Anesthesia Quick Evaluation

## 2017-05-17 ENCOUNTER — Encounter: Payer: Self-pay | Admitting: Gastroenterology

## 2017-06-29 ENCOUNTER — Emergency Department
Admission: EM | Admit: 2017-06-29 | Discharge: 2017-06-29 | Disposition: A | Discharge status: Home / Self Care | Payer: Medicare Other | Attending: Emergency Medicine | Admitting: Emergency Medicine

## 2017-06-29 DIAGNOSIS — Y999 Unspecified external cause status: Secondary | ICD-10-CM | POA: Diagnosis not present

## 2017-06-29 DIAGNOSIS — Z87891 Personal history of nicotine dependence: Secondary | ICD-10-CM | POA: Insufficient documentation

## 2017-06-29 DIAGNOSIS — E119 Type 2 diabetes mellitus without complications: Secondary | ICD-10-CM | POA: Insufficient documentation

## 2017-06-29 DIAGNOSIS — Y929 Unspecified place or not applicable: Secondary | ICD-10-CM | POA: Insufficient documentation

## 2017-06-29 DIAGNOSIS — W57XXXA Bitten or stung by nonvenomous insect and other nonvenomous arthropods, initial encounter: Secondary | ICD-10-CM | POA: Diagnosis not present

## 2017-06-29 DIAGNOSIS — Y939 Activity, unspecified: Secondary | ICD-10-CM | POA: Diagnosis not present

## 2017-06-29 DIAGNOSIS — S90465A Insect bite (nonvenomous), left lesser toe(s), initial encounter: Secondary | ICD-10-CM | POA: Diagnosis not present

## 2017-06-29 DIAGNOSIS — Z7984 Long term (current) use of oral hypoglycemic drugs: Secondary | ICD-10-CM | POA: Insufficient documentation

## 2017-06-29 DIAGNOSIS — I10 Essential (primary) hypertension: Secondary | ICD-10-CM | POA: Diagnosis not present

## 2017-06-29 MED ORDER — BUTAMBEN-TETRACAINE-BENZOCAINE 2-2-14 % EX AERO
INHALATION_SPRAY | CUTANEOUS | Status: AC
  Filled 2017-06-29: qty 20

## 2017-06-29 NOTE — ED Triage Notes (Signed)
Pt states she was bit by something this morning on the right foot and has redness and swelling to the site.

## 2017-06-29 NOTE — ED Notes (Signed)
See triage note  States she may have been stung by something to top of right foot   Min swelling noted to 4th toe with some redness across top of of foot

## 2017-06-29 NOTE — Discharge Instructions (Signed)
Your exam is normal following your insect bite/sting. We can not tell what type of insect bit you based on your symptoms. There does not appear to be any infection or signs of a serious injury. Take OTC Benadryl along with Tylenol or Motrin for pain relief. Apply ice to reduce any swelling. Follow-up with [facility] or your provider as needed.

## 2017-06-30 NOTE — ED Provider Notes (Signed)
[facility] Emergency Department Provider Note ____________________________________________  Time seen: 26  I have reviewed the triage vital signs and the nursing notes.  HISTORY  Chief Complaint  Insect Bite  HPI Alicia Jennings is a 71 y.o. female presents to the ED   Past Medical History:  Diagnosis Date  . Anxiety   . Diabetes mellitus without complication ([place])   . GERD (gastroesophageal reflux disease)   . Heart murmur   . Hypertension   . Hypoglycemia   . Motion sickness    boats    Patient Active Problem List   Diagnosis Date Noted  . Abnormal feces   . Polyp of sigmoid colon     Past Surgical History:  Procedure Laterality Date  . COLONOSCOPY WITH PROPOFOL N/A 05/16/2017   Procedure: COLONOSCOPY WITH PROPOFOL;  Surgeon: Lucilla Lame, MD;  Location: [place];  Service: Endoscopy;  Laterality: N/A;  Diabetic - oral meds  . POLYPECTOMY N/A 05/16/2017   Procedure: POLYPECTOMY;  Surgeon: Lucilla Lame, MD;  Location: [place];  Service: Endoscopy;  Laterality: N/A;  . TUBAL LIGATION      Prior to Admission medications   Medication Sig Start Date End Date Taking? Authorizing Provider  amLODipine (NORVASC) 5 MG tablet Take 5 mg by mouth daily.    [provider]  atorvastatin (LIPITOR) 40 MG tablet Take 40 mg by mouth daily.    [provider]  citalopram (CELEXA) 20 MG tablet Take 20 mg by mouth daily.    [provider]  lisinopril (PRINIVIL,ZESTRIL) 10 MG tablet Take 10 mg by mouth daily.    [provider]  metFORMIN (GLUCOPHAGE) 500 MG tablet Take 500 mg by mouth 2 (two) times daily with a meal.    [provider]  RaNITidine HCl (ACID REDUCER PO) Take 1 tablet by mouth as needed.    [provider]    Allergies Aspirin  No family history on file.  Social History Social History  Substance Use Topics  . Smoking status: Former Smoker    Quit date: 2015  .  Smokeless tobacco: Never Used  . Alcohol use No     Comment: occasional    Review of Systems  Constitutional: Negative for fever. Eyes: Negative for visual changes. ENT: Negative for sore throat. Cardiovascular: Negative for chest pain. Respiratory: Negative for shortness of breath. Musculoskeletal: Negative for back pain. Skin: Negative for rash. Reports insect bite as above Neurological: Negative for headaches, focal weakness or numbness. ____________________________________________  PHYSICAL EXAM:  VITAL SIGNS: ED Triage Vitals  Enc Vitals Group     BP 06/29/17 0934 139/73     Pulse Rate 06/29/17 0934 92     Resp 06/29/17 0934 18     Temp 06/29/17 0934 97.8 F (36.6 C)     Temp Source 06/29/17 0934 Oral     SpO2 06/29/17 0934 99 %     Weight 06/29/17 0934 130 lb (59 kg)     Height 06/29/17 0934 5\' 3"  (1.6 m)     Head Circumference --      Peak Flow --      Pain Score 06/29/17 0933 7     Pain Loc --      Pain Edu? --      Excl. in [place]? --     Constitutional: Alert and oriented. Well appearing and in no distress. Head: Normocephalic and atraumatic. Cardiovascular: Normal rate, regular rhythm. Normal distal pulses. Respiratory: Normal respiratory effort.  Musculoskeletal: Nontender with normal range of motion in all extremities.  Neurologic:  Normal gait without ataxia. Normal speech and language. No gross focal neurologic deficits are appreciated. Skin:  Skin is warm, dry and intact. No rash noted. No obvious focal papule, pustule, punctum, or erythematous lesion to the left 4th toe. ____________________________________________  PROCEDURES  Cetacaine topical to left 4th toe ____________________________________________  INITIAL IMPRESSION / ASSESSMENT AND PLAN / ED COURSE  Patient with evaluation of an insect bite to the left 4th toe. No signs of infection, lymphangitis or serious local reaction. Take OTC Benadryl and apply cortisone cream as needed.   ____________________________________________  FINAL CLINICAL IMPRESSION(S) / ED DIAGNOSES  Final diagnoses:  Insect bite, initial encounter      Melvenia Needles, PA-C 06/30/17 1914    Darel Hong, MD 07/04/17 1459

## 2018-02-08 ENCOUNTER — Other Ambulatory Visit: Payer: Self-pay | Admitting: Nurse Practitioner

## 2018-02-08 DIAGNOSIS — Z1231 Encounter for screening mammogram for malignant neoplasm of breast: Secondary | ICD-10-CM

## 2018-03-01 ENCOUNTER — Ambulatory Visit
Admission: RE | Admit: 2018-03-01 | Discharge: 2018-03-01 | Disposition: A | Discharge status: Home / Self Care | Payer: Medicare Other | Source: Ambulatory Visit | Attending: Nurse Practitioner | Admitting: Nurse Practitioner

## 2018-03-01 DIAGNOSIS — Z1231 Encounter for screening mammogram for malignant neoplasm of breast: Secondary | ICD-10-CM | POA: Insufficient documentation

## 2019-02-12 ENCOUNTER — Other Ambulatory Visit: Payer: Self-pay | Admitting: Nurse Practitioner

## 2019-02-12 DIAGNOSIS — Z1231 Encounter for screening mammogram for malignant neoplasm of breast: Secondary | ICD-10-CM

## 2019-03-06 ENCOUNTER — Ambulatory Visit
Admission: RE | Admit: 2019-03-06 | Discharge: 2019-03-06 | Disposition: A | Discharge status: Home / Self Care | Payer: Medicare Other | Source: Ambulatory Visit | Attending: Nurse Practitioner | Admitting: Nurse Practitioner

## 2019-03-06 DIAGNOSIS — Z1231 Encounter for screening mammogram for malignant neoplasm of breast: Secondary | ICD-10-CM | POA: Insufficient documentation

## 2020-01-02 ENCOUNTER — Other Ambulatory Visit: Payer: Self-pay | Admitting: Nurse Practitioner

## 2020-01-02 DIAGNOSIS — Z1231 Encounter for screening mammogram for malignant neoplasm of breast: Secondary | ICD-10-CM

## 2020-02-18 ENCOUNTER — Ambulatory Visit: Payer: Medicare Other | Attending: Internal Medicine

## 2020-02-18 DIAGNOSIS — Z23 Encounter for immunization: Secondary | ICD-10-CM | POA: Insufficient documentation

## 2020-02-18 NOTE — Progress Notes (Signed)
   Covid-19 Vaccination Clinic  Name:  Alicia Jennings    MRN: [id number] DOB: 1946-05-08  02/18/2020  [name] was observed post Covid-19 immunization for 15 minutes without incidence. She was provided with Vaccine Information Sheet and instruction to access the V-Safe system.   [name] was instructed to call 911 with any severe reactions post vaccine: Marland Kitchen Difficulty breathing  . Swelling of your face and throat  . A fast heartbeat  . A bad rash all over your body  . Dizziness and weakness    Immunizations Administered    Name Date Dose VIS Date Route   Moderna COVID-19 Vaccine 02/18/2020  5:01 PM 0.5 mL 11/27/2019 Intramuscular   Manufacturer: Moderna   Lot: [id number]   [place]VO:[id number]

## 2020-03-06 ENCOUNTER — Ambulatory Visit
Admission: RE | Admit: 2020-03-06 | Discharge: 2020-03-06 | Disposition: A | Discharge status: Home / Self Care | Payer: Medicare Other | Source: Ambulatory Visit | Attending: Nurse Practitioner | Admitting: Nurse Practitioner

## 2020-03-06 DIAGNOSIS — Z1231 Encounter for screening mammogram for malignant neoplasm of breast: Secondary | ICD-10-CM | POA: Insufficient documentation

## 2020-03-18 ENCOUNTER — Ambulatory Visit: Payer: Medicare Other | Attending: Internal Medicine

## 2020-03-18 DIAGNOSIS — Z23 Encounter for immunization: Secondary | ICD-10-CM

## 2020-03-18 NOTE — Progress Notes (Signed)
   Covid-19 Vaccination Clinic  Name:  Channy Knibbs    MRN: [id number] DOB: [birth year]  03/18/2020  Ms. Primeau was observed post Covid-19 immunization for 15 minutes without incident. She was provided with Vaccine Information Sheet and instruction to access the V-Safe system.   Ms. Chrisp was instructed to call 911 with any severe reactions post vaccine: Marland Kitchen Difficulty breathing  . Swelling of face and throat  . A fast heartbeat  . A bad rash all over body  . Dizziness and weakness   Immunizations Administered    Name Date Dose VIS Date Route   Moderna COVID-19 Vaccine 03/18/2020  4:54 PM 0.5 mL 11/27/2019 Intramuscular   Manufacturer: Moderna   Lot: [id number]   [place]VO:[id number]

## 2021-02-19 ENCOUNTER — Other Ambulatory Visit: Payer: Self-pay | Admitting: Nurse Practitioner

## 2021-02-19 DIAGNOSIS — Z1231 Encounter for screening mammogram for malignant neoplasm of breast: Secondary | ICD-10-CM

## 2021-03-12 ENCOUNTER — Other Ambulatory Visit: Payer: Self-pay

## 2021-03-12 ENCOUNTER — Ambulatory Visit
Admission: RE | Admit: 2021-03-12 | Discharge: 2021-03-12 | Disposition: A | Discharge status: Home / Self Care | Payer: Medicare Other | Source: Ambulatory Visit | Attending: Nurse Practitioner | Admitting: Nurse Practitioner

## 2021-03-12 DIAGNOSIS — Z1231 Encounter for screening mammogram for malignant neoplasm of breast: Secondary | ICD-10-CM | POA: Insufficient documentation

## 2022-01-14 ENCOUNTER — Other Ambulatory Visit: Payer: Self-pay | Admitting: Nurse Practitioner

## 2022-01-14 DIAGNOSIS — Z1231 Encounter for screening mammogram for malignant neoplasm of breast: Secondary | ICD-10-CM

## 2022-03-17 ENCOUNTER — Ambulatory Visit
Admission: RE | Admit: 2022-03-17 | Discharge: 2022-03-17 | Disposition: A | Discharge status: Home / Self Care | Payer: Medicare Other | Source: Ambulatory Visit | Attending: Nurse Practitioner | Admitting: Nurse Practitioner

## 2022-03-17 ENCOUNTER — Other Ambulatory Visit: Payer: Self-pay

## 2022-03-17 DIAGNOSIS — Z1231 Encounter for screening mammogram for malignant neoplasm of breast: Secondary | ICD-10-CM | POA: Insufficient documentation

## 2023-01-31 ENCOUNTER — Ambulatory Visit (INDEPENDENT_AMBULATORY_CARE_PROVIDER_SITE_OTHER): Payer: Medicare Other | Admitting: Nurse Practitioner

## 2023-01-31 ENCOUNTER — Encounter: Payer: Self-pay | Admitting: Nurse Practitioner

## 2023-01-31 VITALS — BP 110/64 | HR 99 | Temp 98.0°F | Ht 61.0 in | Wt 141.0 lb

## 2023-01-31 DIAGNOSIS — R195 Other fecal abnormalities: Secondary | ICD-10-CM

## 2023-01-31 DIAGNOSIS — E119 Type 2 diabetes mellitus without complications: Secondary | ICD-10-CM | POA: Diagnosis not present

## 2023-01-31 DIAGNOSIS — J069 Acute upper respiratory infection, unspecified: Secondary | ICD-10-CM

## 2023-01-31 DIAGNOSIS — E782 Mixed hyperlipidemia: Secondary | ICD-10-CM | POA: Insufficient documentation

## 2023-01-31 DIAGNOSIS — I10 Essential (primary) hypertension: Secondary | ICD-10-CM | POA: Diagnosis not present

## 2023-01-31 MED ORDER — AZITHROMYCIN 250 MG PO TABS: ORAL_TABLET | ORAL | 0 refills | Status: AC

## 2023-01-31 MED ORDER — FLUTICASONE PROPIONATE 50 MCG/ACT NA SUSP: 2.0000 | Freq: Two times a day (BID) | NASAL | 2 refills | Status: AC

## 2023-01-31 NOTE — Progress Notes (Signed)
Patient ID: Alicia Jennings, Sex: female DOB: 08-Feb-1946, 77 y.o..   MRN: [id number]   Chief Complaint  Patient presents with   Follow-up     Patient here today for 6 month follow up and discuss recent fasting labs, A1c trended down to 7.5%.  She c/o cough and congestion, has been taking mucinex DM.     Review of Systems  Constitutional: Negative.   HENT:  Positive for congestion.   Eyes: Negative.   Respiratory:  Positive for cough.   Cardiovascular: Negative.   Gastrointestinal:  Positive for heartburn.  Genitourinary: Negative.   Musculoskeletal:  Positive for back pain and neck pain.  Skin: Negative.   Neurological: Negative.   Endo/Heme/Allergies: Negative.   Psychiatric/Behavioral:  The patient has insomnia.      Physical Exam Constitutional:      Appearance: Normal appearance.  HENT:     Head: Normocephalic.     Nose: Congestion present.     Mouth/Throat:     Mouth: Mucous membranes are moist.  Eyes:     Pupils: Pupils are equal, round, and reactive to light.  Cardiovascular:     Rate and Rhythm: Normal rate and regular rhythm.  Pulmonary:     Breath sounds: Normal breath sounds.  Abdominal:     General: Bowel sounds are normal.     Palpations: Abdomen is soft.  Musculoskeletal:        General: Normal range of motion.     Cervical back: Normal range of motion.  Skin:    General: Skin is warm and dry.  Neurological:     Mental Status: She is alert and oriented to person, place, and time.  Psychiatric:        Mood and Affect: Mood normal.        Behavior: Behavior normal.       Problem List Items Addressed This Visit       Cardiovascular and Mediastinum   Essential hypertension, benign - Primary     Endocrine   Diabetes mellitus without complication ([place])     Other   Abnormal feces   Mixed hyperlipidemia   Other Visit Diagnoses     Viral upper respiratory tract infection             Evern Bio, NP

## 2023-01-31 NOTE — Patient Instructions (Addendum)
1) Keep check on diet so sugar will be lower at next visit 2) Antibiotic and nasal spray for current URI 3) Follow up appt in 6 months, fasting labs prior

## 2023-02-08 ENCOUNTER — Other Ambulatory Visit: Payer: Self-pay | Admitting: Nurse Practitioner

## 2023-03-04 ENCOUNTER — Other Ambulatory Visit: Payer: Self-pay | Admitting: Nurse Practitioner

## 2023-03-04 DIAGNOSIS — Z1231 Encounter for screening mammogram for malignant neoplasm of breast: Secondary | ICD-10-CM

## 2023-03-17 ENCOUNTER — Other Ambulatory Visit: Payer: Self-pay | Admitting: Nurse Practitioner

## 2023-03-31 ENCOUNTER — Ambulatory Visit
Admission: RE | Admit: 2023-03-31 | Discharge: 2023-03-31 | Disposition: A | Discharge status: Home / Self Care | Payer: Medicare Other | Source: Ambulatory Visit | Attending: Nurse Practitioner | Admitting: Nurse Practitioner

## 2023-03-31 DIAGNOSIS — Z1231 Encounter for screening mammogram for malignant neoplasm of breast: Secondary | ICD-10-CM | POA: Insufficient documentation

## 2023-04-04 ENCOUNTER — Other Ambulatory Visit: Payer: Self-pay | Admitting: Nurse Practitioner

## 2023-05-03 ENCOUNTER — Other Ambulatory Visit: Payer: Self-pay | Admitting: Nurse Practitioner

## 2023-07-19 ENCOUNTER — Other Ambulatory Visit: Payer: Self-pay | Admitting: Nurse Practitioner

## 2023-07-20 ENCOUNTER — Other Ambulatory Visit: Payer: Self-pay | Admitting: Nurse Practitioner

## 2023-07-27 ENCOUNTER — Other Ambulatory Visit: Payer: Medicare Other

## 2023-07-27 ENCOUNTER — Other Ambulatory Visit: Payer: Self-pay | Admitting: Nurse Practitioner

## 2023-07-27 DIAGNOSIS — E782 Mixed hyperlipidemia: Secondary | ICD-10-CM

## 2023-07-27 DIAGNOSIS — E119 Type 2 diabetes mellitus without complications: Secondary | ICD-10-CM

## 2023-07-27 DIAGNOSIS — Z1329 Encounter for screening for other suspected endocrine disorder: Secondary | ICD-10-CM

## 2023-07-27 DIAGNOSIS — I10 Essential (primary) hypertension: Secondary | ICD-10-CM

## 2023-08-01 ENCOUNTER — Ambulatory Visit: Payer: Medicare Other | Admitting: Cardiology

## 2023-08-01 ENCOUNTER — Encounter: Payer: Self-pay | Admitting: Cardiology

## 2023-08-01 VITALS — BP 114/78 | HR 101 | Ht 61.0 in | Wt 143.0 lb

## 2023-08-01 DIAGNOSIS — E119 Type 2 diabetes mellitus without complications: Secondary | ICD-10-CM

## 2023-08-01 DIAGNOSIS — E782 Mixed hyperlipidemia: Secondary | ICD-10-CM

## 2023-08-01 DIAGNOSIS — Z1211 Encounter for screening for malignant neoplasm of colon: Secondary | ICD-10-CM

## 2023-08-01 DIAGNOSIS — I10 Essential (primary) hypertension: Secondary | ICD-10-CM | POA: Diagnosis not present

## 2023-08-01 NOTE — Progress Notes (Signed)
Established Patient Office Visit  Subjective:  Patient ID: Alicia Jennings, female    DOB: [birth year]  Age: 77 y.o. MRN: [id number]  Chief Complaint  Patient presents with   Follow-up    6 month follow up    Patient in office for 6 month follow up. Patient doing well, no complaints today.  Patient due for colonoscopy, order sent.  Patient will call to schedule an ophthalmology appointment.  Fasting labs, lipid panel controlled on rosuvastatin. Hgb A1c controlled on metformin.     No other concerns at this time.   Past Medical History:  Diagnosis Date   Anxiety    Diabetes mellitus without complication (HCC)    GERD (gastroesophageal reflux disease)    Heart murmur    Hypertension    Hypoglycemia    Motion sickness    boats    Past Surgical History:  Procedure Laterality Date   COLONOSCOPY WITH PROPOFOL N/A 05/16/2017   Procedure: COLONOSCOPY WITH PROPOFOL;  Surgeon: Midge Minium, MD;  Location: [facility];  Service: Endoscopy;  Laterality: N/A;  Diabetic - oral meds   POLYPECTOMY N/A 05/16/2017   Procedure: POLYPECTOMY;  Surgeon: Midge Minium, MD;  Location: [facility];  Service: Endoscopy;  Laterality: N/A;   TUBAL LIGATION      Social History   Socioeconomic History   Marital status: Divorced    Spouse name: Not on file   Number of children: Not on file   Years of education: Not on file   Highest education level: Not on file  Occupational History   Not on file  Tobacco Use   Smoking status: Former    Current packs/day: 0.00    Types: Cigarettes    Quit date: 2015    Years since quitting: 9.6   Smokeless tobacco: Never  Substance and Sexual Activity   Alcohol use: No    Comment: occasional   Drug use: Not on file   Sexual activity: Not on file  Other Topics Concern   Not on file  Social History Narrative   Not on file   Social Determinants of Health   Financial Resource Strain: Not on file  Food Insecurity: Not on file   Transportation Needs: Not on file  Physical Activity: Not on file  Stress: Not on file  Social Connections: Not on file  Intimate Partner Violence: Not on file    Family History  Problem Relation Age of Onset   Breast cancer Neg Hx     Allergies  Allergen Reactions   Aspirin Rash    Review of Systems  Constitutional: Negative.   HENT: Negative.    Eyes: Negative.   Respiratory: Negative.  Negative for shortness of breath.   Cardiovascular: Negative.  Negative for chest pain.  Gastrointestinal: Negative.  Negative for abdominal pain, constipation and diarrhea.  Genitourinary: Negative.   Musculoskeletal:  Negative for joint pain and myalgias.  Skin: Negative.   Neurological: Negative.  Negative for dizziness and headaches.  Endo/Heme/Allergies: Negative.   All other systems reviewed and are negative.      Objective:   BP 114/78   Pulse (!) 101   Ht 5\' 1"  (1.549 m)   Wt 143 lb (64.9 kg)   SpO2 98%   BMI 27.02 kg/m   Vitals:   08/01/23 1508  BP: 114/78  Pulse: (!) 101  Height: 5\' 1"  (1.549 m)  Weight: 143 lb (64.9 kg)  SpO2: 98%  BMI (Calculated): 27.03    Physical Exam  Vitals and nursing note reviewed.  Constitutional:      Appearance: Normal appearance. She is normal weight.  HENT:     Head: Normocephalic and atraumatic.     Nose: Nose normal.     Mouth/Throat:     Mouth: Mucous membranes are moist.  Eyes:     Extraocular Movements: Extraocular movements intact.     Conjunctiva/sclera: Conjunctivae normal.     Pupils: Pupils are equal, round, and reactive to light.  Cardiovascular:     Rate and Rhythm: Normal rate and regular rhythm.     Pulses: Normal pulses.     Heart sounds: Normal heart sounds.  Pulmonary:     Effort: Pulmonary effort is normal.     Breath sounds: Normal breath sounds.  Abdominal:     General: Abdomen is flat. Bowel sounds are normal.     Palpations: Abdomen is soft.  Musculoskeletal:        General: Normal range of  motion.     Cervical back: Normal range of motion.  Skin:    General: Skin is warm and dry.  Neurological:     General: No focal deficit present.     Mental Status: She is alert and oriented to person, place, and time.  Psychiatric:        Mood and Affect: Mood normal.        Behavior: Behavior normal.        Thought Content: Thought content normal.        Judgment: Judgment normal.      No results found for any visits on 08/01/23.  Recent Results (from the past 2160 hour(s))  Hemoglobin A1c     Status: Abnormal   Collection Time: 07/27/23  9:26 AM  Result Value Ref Range   Hgb A1c MFr Bld 7.4 (H) 4.8 - 5.6 %    Comment:          Prediabetes: 5.7 - 6.4          Diabetes: >6.4          Glycemic control for adults with diabetes: <7.0    Est. average glucose Bld gHb Est-mCnc 166 mg/dL  TSH     Status: None   Collection Time: 07/27/23  9:26 AM  Result Value Ref Range   TSH 0.639 0.450 - 4.500 uIU/mL  CMP14+EGFR     Status: Abnormal   Collection Time: 07/27/23  9:26 AM  Result Value Ref Range   Glucose 107 (H) 70 - 99 mg/dL   BUN 10 8 - 27 mg/dL   Creatinine, Ser 8.29 0.57 - 1.00 mg/dL   eGFR 93 >56 OZ/HYQ/6.57   BUN/Creatinine Ratio 16 12 - 28   Sodium 136 134 - 144 mmol/L   Potassium 5.1 3.5 - 5.2 mmol/L   Chloride 98 96 - 106 mmol/L   CO2 26 20 - 29 mmol/L   Calcium 9.5 8.7 - 10.3 mg/dL   Total Protein 6.7 6.0 - 8.5 g/dL   Albumin 4.2 3.8 - 4.8 g/dL   Globulin, Total 2.5 1.5 - 4.5 g/dL   Bilirubin Total 0.3 0.0 - 1.2 mg/dL   Alkaline Phosphatase 107 44 - 121 IU/L   AST 17 0 - 40 IU/L   ALT 10 0 - 32 IU/L  Lipid panel     Status: None   Collection Time: 07/27/23  9:26 AM  Result Value Ref Range   Cholesterol, Total 135 100 - 199 mg/dL   Triglycerides 76 0 - 149  mg/dL   HDL 61 >16 mg/dL   VLDL Cholesterol Cal 15 5 - 40 mg/dL   LDL Chol Calc (NIH) 59 0 - 99 mg/dL   Chol/HDL Ratio 2.2 0.0 - 4.4 ratio    Comment:                                   T. Chol/HDL  Ratio                                             Men  Women                               1/2 Avg.Risk  3.4    3.3                                   Avg.Risk  5.0    4.4                                2X Avg.Risk  9.6    7.1                                3X Avg.Risk 23.4   11.0       Assessment & Plan:  Continue same medications.  Order sent for colonoscopy.  Patient to schedule ophthalmology appointment.   Problem List Items Addressed This Visit       Cardiovascular and Mediastinum   Essential hypertension, benign - Primary     Endocrine   Diabetes mellitus without complication (HCC)     Other   Mixed hyperlipidemia   Other Visit Diagnoses     Colon cancer screening       Relevant Orders   Ambulatory referral to Gastroenterology       Return in about 6 months (around 02/01/2024) for with fasting labs prior.   Total time spent: 30 minutes  Google, NP  08/01/2023   This document may have been prepared by Dragon Voice Recognition software and as such may include unintentional dictation errors.

## 2023-08-04 ENCOUNTER — Encounter: Payer: Self-pay | Admitting: Gastroenterology

## 2023-08-08 ENCOUNTER — Encounter: Payer: Self-pay | Admitting: *Deleted

## 2023-10-10 ENCOUNTER — Other Ambulatory Visit: Payer: Self-pay | Admitting: Family

## 2024-01-05 ENCOUNTER — Other Ambulatory Visit: Payer: Self-pay | Admitting: Cardiology

## 2024-01-13 ENCOUNTER — Other Ambulatory Visit: Payer: Self-pay

## 2024-01-13 MED ORDER — ROSUVASTATIN CALCIUM 20 MG PO TABS: 20.0000 mg | ORAL_TABLET | Freq: Every day | ORAL | 1 refills | Status: DC

## 2024-01-16 ENCOUNTER — Other Ambulatory Visit: Payer: Self-pay | Admitting: Family

## 2024-01-30 ENCOUNTER — Other Ambulatory Visit: Payer: Medicare Other

## 2024-01-30 DIAGNOSIS — Z1329 Encounter for screening for other suspected endocrine disorder: Secondary | ICD-10-CM

## 2024-01-30 DIAGNOSIS — E782 Mixed hyperlipidemia: Secondary | ICD-10-CM

## 2024-01-30 DIAGNOSIS — E119 Type 2 diabetes mellitus without complications: Secondary | ICD-10-CM

## 2024-01-30 DIAGNOSIS — I10 Essential (primary) hypertension: Secondary | ICD-10-CM

## 2024-01-31 LAB — LIPID PANEL
Chol/HDL Ratio: 1.9 {ratio} (ref 0.0–4.4)
Cholesterol, Total: 144 mg/dL (ref 100–199)
HDL: 74 mg/dL (ref >39)
LDL Chol Calc (NIH): 56 mg/dL (ref 0–99)
Triglycerides: 69 mg/dL (ref 0–149)
VLDL Cholesterol Cal: 14 mg/dL (ref 5–40)

## 2024-01-31 LAB — HEMOGLOBIN A1C
Est. average glucose Bld gHb Est-mCnc: 186 mg/dL
Hgb A1c MFr Bld: 8.1 % — ABNORMAL HIGH (ref 4.8–5.6)

## 2024-01-31 LAB — CMP14+EGFR
ALT: 9 [IU]/L (ref 0–32)
AST: 15 [IU]/L (ref 0–40)
Albumin: 4.3 g/dL (ref 3.8–4.8)
Alkaline Phosphatase: 116 [IU]/L (ref 44–121)
BUN/Creatinine Ratio: 13 (ref 12–28)
BUN: 7 mg/dL — ABNORMAL LOW (ref 8–27)
Bilirubin Total: 0.2 mg/dL (ref 0.0–1.2)
CO2: 24 mmol/L (ref 20–29)
Calcium: 9.2 mg/dL (ref 8.7–10.3)
Chloride: 100 mmol/L (ref 96–106)
Creatinine, Ser: 0.55 mg/dL — ABNORMAL LOW (ref 0.57–1.00)
Globulin, Total: 2.5 g/dL (ref 1.5–4.5)
Glucose: 115 mg/dL — ABNORMAL HIGH (ref 70–99)
Potassium: 5.2 mmol/L (ref 3.5–5.2)
Sodium: 136 mmol/L (ref 134–144)
Total Protein: 6.8 g/dL (ref 6.0–8.5)
eGFR: 95 mL/min/{1.73_m2} (ref >59)

## 2024-01-31 LAB — TSH: TSH: 0.773 u[IU]/mL (ref 0.450–4.500)

## 2024-02-02 ENCOUNTER — Encounter: Payer: Self-pay | Admitting: Cardiology

## 2024-02-02 ENCOUNTER — Ambulatory Visit: Payer: Medicare Other | Admitting: Cardiology

## 2024-02-02 VITALS — BP 118/70 | HR 89 | Ht 61.0 in | Wt 141.0 lb

## 2024-02-02 DIAGNOSIS — E119 Type 2 diabetes mellitus without complications: Secondary | ICD-10-CM

## 2024-02-02 DIAGNOSIS — I10 Essential (primary) hypertension: Secondary | ICD-10-CM | POA: Diagnosis not present

## 2024-02-02 DIAGNOSIS — E782 Mixed hyperlipidemia: Secondary | ICD-10-CM | POA: Diagnosis not present

## 2024-02-02 NOTE — Progress Notes (Signed)
 Established Patient Office Visit  Subjective:  Patient ID: Alicia Jennings, female    DOB: 1946/07/16  Age: 78 y.o. MRN: [id number]  Chief Complaint  Patient presents with   Follow-up    6 Months Follow Up    Patient in office for 6 month follow up, discuss recent lab results. Patient doing well, no complaints today.  Discussed recent lab work. LDL well controlled. Hgb A1c elevated, patient confirms she is taking her medications as prescribed. Will work on diet and exercise to lower A1c.     No other concerns at this time.   Past Medical History:  Diagnosis Date   Anxiety    Diabetes mellitus without complication (HCC)    GERD (gastroesophageal reflux disease)    Heart murmur    Hypertension    Hypoglycemia    Motion sickness    boats    Past Surgical History:  Procedure Laterality Date   COLONOSCOPY WITH PROPOFOL  N/A 05/16/2017   Procedure: COLONOSCOPY WITH PROPOFOL ;  Surgeon: Jinny Carmine, MD;  Location: [facility];  Service: Endoscopy;  Laterality: N/A;  Diabetic - oral meds   POLYPECTOMY N/A 05/16/2017   Procedure: POLYPECTOMY;  Surgeon: Jinny Carmine, MD;  Location: [facility];  Service: Endoscopy;  Laterality: N/A;   TUBAL LIGATION      Social History   Socioeconomic History   Marital status: Divorced    Spouse name: Not on file   Number of children: Not on file   Years of education: Not on file   Highest education level: Not on file  Occupational History   Not on file  Tobacco Use   Smoking status: Former    Current packs/day: 0.00    Types: Cigarettes    Quit date: 2015    Years since quitting: 10.1   Smokeless tobacco: Never  Substance and Sexual Activity   Alcohol use: No    Comment: occasional   Drug use: Not on file   Sexual activity: Not on file  Other Topics Concern   Not on file  Social History Narrative   Not on file   Social Drivers of Health   Financial Resource Strain: Not on file  Food Insecurity: Not on file   Transportation Needs: Not on file  Physical Activity: Not on file  Stress: Not on file  Social Connections: Not on file  Intimate Partner Violence: Not on file    Family History  Problem Relation Age of Onset   Breast cancer Neg Hx     Allergies  Allergen Reactions   Aspirin Rash    Outpatient Medications Prior to Visit  Medication Sig   amLODipine  (NORVASC ) 5 MG tablet TAKE 1 TABLET BY MOUTH EVERY MORNING   citalopram (CELEXA) 20 MG tablet TAKE 1 TABLET BY MOUTH EVERY DAY   cyclobenzaprine (FLEXERIL) 5 MG tablet TAKE 1 TABLET BY MOUTH EVERY NIGHT AT BEDTIME FOR NECK SPASMS   fluticasone  (FLONASE ) 50 MCG/ACT nasal spray Place 2 sprays into both nostrils in the morning and at bedtime.   metFORMIN  (GLUCOPHAGE ) 500 MG tablet TAKE 1 TABLET BY MOUTH EVERY DAY WITH FOOD   olmesartan  (BENICAR ) 20 MG tablet TAKE 1 TABLET(20 MG) BY MOUTH DAILY   RaNITidine HCl (ACID REDUCER PO) Take 1 tablet by mouth as needed. (Patient not taking: Reported on 08/01/2023)   rosuvastatin  (CRESTOR ) 20 MG tablet Take 1 tablet (20 mg total) by mouth daily.   No facility-administered medications prior to visit.    Review of Systems  Constitutional: Negative.   HENT: Negative.    Eyes: Negative.   Respiratory: Negative.  Negative for shortness of breath.   Cardiovascular: Negative.  Negative for chest pain.  Gastrointestinal: Negative.  Negative for abdominal pain, constipation and diarrhea.  Genitourinary: Negative.   Musculoskeletal:  Negative for joint pain and myalgias.  Skin: Negative.   Neurological: Negative.  Negative for dizziness and headaches.  Endo/Heme/Allergies: Negative.   All other systems reviewed and are negative.      Objective:   BP 118/70   Pulse 89   Ht 5' 1 (1.549 m)   Wt 141 lb (64 kg)   SpO2 97%   BMI 26.64 kg/m   Vitals:   02/02/24 1532  BP: 118/70  Pulse: 89  Height: 5' 1 (1.549 m)  Weight: 141 lb (64 kg)  SpO2: 97%  BMI (Calculated): 26.66    Physical  Exam Vitals and nursing note reviewed.  Constitutional:      Appearance: Normal appearance. She is normal weight.  HENT:     Head: Normocephalic and atraumatic.     Nose: Nose normal.     Mouth/Throat:     Mouth: Mucous membranes are moist.  Eyes:     Extraocular Movements: Extraocular movements intact.     Conjunctiva/sclera: Conjunctivae normal.     Pupils: Pupils are equal, round, and reactive to light.  Cardiovascular:     Rate and Rhythm: Normal rate and regular rhythm.     Pulses: Normal pulses.     Heart sounds: Normal heart sounds.  Pulmonary:     Effort: Pulmonary effort is normal.     Breath sounds: Normal breath sounds.  Abdominal:     General: Abdomen is flat. Bowel sounds are normal.     Palpations: Abdomen is soft.  Musculoskeletal:        General: Normal range of motion.     Cervical back: Normal range of motion.  Skin:    General: Skin is warm and dry.  Neurological:     General: No focal deficit present.     Mental Status: She is alert and oriented to person, place, and time.  Psychiatric:        Mood and Affect: Mood normal.        Behavior: Behavior normal.        Thought Content: Thought content normal.        Judgment: Judgment normal.      No results found for any visits on 02/02/24.  Recent Results (from the past 2160 hours)  Hemoglobin A1c     Status: Abnormal   Collection Time: 01/30/24  8:56 AM  Result Value Ref Range   Hgb A1c MFr Bld 8.1 (H) 4.8 - 5.6 %    Comment:          Prediabetes: 5.7 - 6.4          Diabetes: >6.4          Glycemic control for adults with diabetes: <7.0    Est. average glucose Bld gHb Est-mCnc 186 mg/dL  TSH     Status: None   Collection Time: 01/30/24  8:56 AM  Result Value Ref Range   TSH 0.773 0.450 - 4.500 uIU/mL  CMP14+EGFR     Status: Abnormal   Collection Time: 01/30/24  8:56 AM  Result Value Ref Range   Glucose 115 (H) 70 - 99 mg/dL   BUN 7 (L) 8 - 27 mg/dL   Creatinine, Ser 9.44 (L) 0.57 -  1.00  mg/dL   eGFR 95 >40 fO/fpw/8.26   BUN/Creatinine Ratio 13 12 - 28   Sodium 136 134 - 144 mmol/L   Potassium 5.2 3.5 - 5.2 mmol/L   Chloride 100 96 - 106 mmol/L   CO2 24 20 - 29 mmol/L   Calcium  9.2 8.7 - 10.3 mg/dL   Total Protein 6.8 6.0 - 8.5 g/dL   Albumin 4.3 3.8 - 4.8 g/dL   Globulin, Total 2.5 1.5 - 4.5 g/dL   Bilirubin Total 0.2 0.0 - 1.2 mg/dL   Alkaline Phosphatase 116 44 - 121 IU/L   AST 15 0 - 40 IU/L   ALT 9 0 - 32 IU/L  Lipid panel     Status: None   Collection Time: 01/30/24  8:56 AM  Result Value Ref Range   Cholesterol, Total 144 100 - 199 mg/dL   Triglycerides 69 0 - 149 mg/dL   HDL 74 >60 mg/dL   VLDL Cholesterol Cal 14 5 - 40 mg/dL   LDL Chol Calc (NIH) 56 0 - 99 mg/dL   Chol/HDL Ratio 1.9 0.0 - 4.4 ratio    Comment:                                   T. Chol/HDL Ratio                                             Men  Women                               1/2 Avg.Risk  3.4    3.3                                   Avg.Risk  5.0    4.4                                2X Avg.Risk  9.6    7.1                                3X Avg.Risk 23.4   11.0       Assessment & Plan:  Work on diet and exercise to lower A1c.  Problem List Items Addressed This Visit       Cardiovascular and Mediastinum   Essential hypertension, benign - Primary     Endocrine   Diabetes mellitus without complication (HCC)     Other   Mixed hyperlipidemia    Return in about 6 months (around 08/01/2024) for with fasting labs prior.   Total time spent: 25 minutes  Google, NP  02/02/2024   This document may have been prepared by Dragon Voice Recognition software and as such may include unintentional dictation errors.

## 2024-03-21 ENCOUNTER — Other Ambulatory Visit: Payer: Self-pay | Admitting: Cardiology

## 2024-03-21 DIAGNOSIS — Z1231 Encounter for screening mammogram for malignant neoplasm of breast: Secondary | ICD-10-CM

## 2024-04-03 ENCOUNTER — Ambulatory Visit
Admission: RE | Admit: 2024-04-03 | Discharge: 2024-04-03 | Disposition: A | Discharge status: Home / Self Care | Source: Ambulatory Visit | Attending: Cardiology | Admitting: Cardiology

## 2024-04-03 ENCOUNTER — Encounter

## 2024-04-03 DIAGNOSIS — Z1231 Encounter for screening mammogram for malignant neoplasm of breast: Secondary | ICD-10-CM | POA: Insufficient documentation

## 2024-04-06 ENCOUNTER — Other Ambulatory Visit: Payer: Self-pay

## 2024-04-06 MED ORDER — AMLODIPINE BESYLATE 5 MG PO TABS: 5.0000 mg | ORAL_TABLET | Freq: Every morning | ORAL | 3 refills | Status: AC

## 2024-04-06 MED ORDER — OLMESARTAN MEDOXOMIL 20 MG PO TABS: 20.0000 mg | ORAL_TABLET | Freq: Every day | ORAL | 0 refills | Status: DC

## 2024-04-16 ENCOUNTER — Other Ambulatory Visit: Payer: Self-pay

## 2024-04-16 MED ORDER — METFORMIN HCL 500 MG PO TABS
500.0000 mg | ORAL_TABLET | Freq: Every day | ORAL | 3 refills | Status: DC
Start: 2024-04-16 — End: 2024-07-12

## 2024-07-03 ENCOUNTER — Other Ambulatory Visit: Payer: Self-pay

## 2024-07-03 MED ORDER — OLMESARTAN MEDOXOMIL 20 MG PO TABS: 20.0000 mg | ORAL_TABLET | Freq: Every day | ORAL | 0 refills | Status: DC

## 2024-07-05 ENCOUNTER — Other Ambulatory Visit

## 2024-07-05 DIAGNOSIS — I10 Essential (primary) hypertension: Secondary | ICD-10-CM

## 2024-07-05 DIAGNOSIS — E782 Mixed hyperlipidemia: Secondary | ICD-10-CM

## 2024-07-05 DIAGNOSIS — E119 Type 2 diabetes mellitus without complications: Secondary | ICD-10-CM

## 2024-07-05 DIAGNOSIS — Z1329 Encounter for screening for other suspected endocrine disorder: Secondary | ICD-10-CM

## 2024-07-06 ENCOUNTER — Ambulatory Visit: Payer: Self-pay | Admitting: Cardiology

## 2024-07-06 LAB — CMP14+EGFR
ALT: 9 IU/L (ref 0–32)
AST: 17 IU/L (ref 0–40)
Albumin: 4.3 g/dL (ref 3.8–4.8)
Alkaline Phosphatase: 111 IU/L (ref 44–121)
BUN/Creatinine Ratio: 18 (ref 12–28)
BUN: 12 mg/dL (ref 8–27)
Bilirubin Total: 0.3 mg/dL (ref 0.0–1.2)
CO2: 23 mmol/L (ref 20–29)
Calcium: 9.8 mg/dL (ref 8.7–10.3)
Chloride: 100 mmol/L (ref 96–106)
Creatinine, Ser: 0.66 mg/dL (ref 0.57–1.00)
Globulin, Total: 2.6 g/dL (ref 1.5–4.5)
Glucose: 120 mg/dL — ABNORMAL HIGH (ref 70–99)
Potassium: 5.1 mmol/L (ref 3.5–5.2)
Sodium: 138 mmol/L (ref 134–144)
Total Protein: 6.9 g/dL (ref 6.0–8.5)
eGFR: 91 mL/min/1.73 (ref >59)

## 2024-07-06 LAB — LIPID PANEL
Chol/HDL Ratio: 2.1 ratio (ref 0.0–4.4)
Cholesterol, Total: 140 mg/dL (ref 100–199)
HDL: 68 mg/dL (ref >39)
LDL Chol Calc (NIH): 58 mg/dL (ref 0–99)
Triglycerides: 70 mg/dL (ref 0–149)
VLDL Cholesterol Cal: 14 mg/dL (ref 5–40)

## 2024-07-06 LAB — HEMOGLOBIN A1C
Est. average glucose Bld gHb Est-mCnc: 160 mg/dL
Hgb A1c MFr Bld: 7.2 % — ABNORMAL HIGH (ref 4.8–5.6)

## 2024-07-06 LAB — TSH: TSH: 0.722 u[IU]/mL (ref 0.450–4.500)

## 2024-07-09 ENCOUNTER — Other Ambulatory Visit: Payer: Self-pay | Admitting: Cardiology

## 2024-07-12 ENCOUNTER — Telehealth: Payer: Self-pay

## 2024-07-12 ENCOUNTER — Ambulatory Visit (INDEPENDENT_AMBULATORY_CARE_PROVIDER_SITE_OTHER): Admitting: Cardiology

## 2024-07-12 ENCOUNTER — Encounter: Payer: Self-pay | Admitting: Cardiology

## 2024-07-12 VITALS — BP 128/70 | HR 84 | Ht 61.0 in | Wt 143.6 lb

## 2024-07-12 DIAGNOSIS — E782 Mixed hyperlipidemia: Secondary | ICD-10-CM

## 2024-07-12 DIAGNOSIS — Z1211 Encounter for screening for malignant neoplasm of colon: Secondary | ICD-10-CM | POA: Diagnosis not present

## 2024-07-12 DIAGNOSIS — I10 Essential (primary) hypertension: Secondary | ICD-10-CM | POA: Diagnosis not present

## 2024-07-12 DIAGNOSIS — E119 Type 2 diabetes mellitus without complications: Secondary | ICD-10-CM

## 2024-07-12 MED ORDER — METFORMIN HCL 500 MG PO TABS: 500.0000 mg | ORAL_TABLET | Freq: Every day | ORAL | 3 refills | Status: AC

## 2024-07-12 NOTE — Progress Notes (Signed)
 Established Patient Office Visit  Subjective:  Patient ID: Alicia Jennings, female    DOB: 1946-09-16  Age: 78 y.o. MRN: [id number]  Chief Complaint  Patient presents with   Follow-up    6 month lab results    Patient in office for 6 month follow up, discuss recent lab results. Patient doing well, no complaints today.  Discussed recent lab work. LDL well controlled. Hgb A1c improved.  Due for colonoscopy, referral sent. Continue same medications.    No other concerns at this time.   Past Medical History:  Diagnosis Date   Anxiety    Diabetes mellitus without complication (HCC)    GERD (gastroesophageal reflux disease)    Heart murmur    Hypertension    Hypoglycemia    Motion sickness    boats    Past Surgical History:  Procedure Laterality Date   COLONOSCOPY WITH PROPOFOL  N/A 05/16/2017   Procedure: COLONOSCOPY WITH PROPOFOL ;  Surgeon: Jinny Carmine, MD;  Location: [facility];  Service: Endoscopy;  Laterality: N/A;  Diabetic - oral meds   POLYPECTOMY N/A 05/16/2017   Procedure: POLYPECTOMY;  Surgeon: Jinny Carmine, MD;  Location: [facility];  Service: Endoscopy;  Laterality: N/A;   TUBAL LIGATION      Social History   Socioeconomic History   Marital status: Divorced    Spouse name: Not on file   Number of children: Not on file   Years of education: Not on file   Highest education level: Not on file  Occupational History   Not on file  Tobacco Use   Smoking status: Former    Current packs/day: 0.00    Types: Cigarettes    Quit date: 2015    Years since quitting: 10.5   Smokeless tobacco: Never  Substance and Sexual Activity   Alcohol use: No    Comment: occasional   Drug use: Not on file   Sexual activity: Not on file  Other Topics Concern   Not on file  Social History Narrative   Not on file   Social Drivers of Health   Financial Resource Strain: Not on file  Food Insecurity: Not on file  Transportation Needs: Not on file  Physical  Activity: Not on file  Stress: Not on file  Social Connections: Not on file  Intimate Partner Violence: Not on file    Family History  Problem Relation Age of Onset   Breast cancer Neg Hx     Allergies  Allergen Reactions   Aspirin Rash    Outpatient Medications Prior to Visit  Medication Sig   amLODipine  (NORVASC ) 5 MG tablet Take 1 tablet (5 mg total) by mouth every morning.   citalopram (CELEXA) 20 MG tablet TAKE 1 TABLET BY MOUTH EVERY DAY   cyclobenzaprine (FLEXERIL) 5 MG tablet TAKE 1 TABLET BY MOUTH EVERY NIGHT AT BEDTIME FOR NECK SPASMS   fluticasone  (FLONASE ) 50 MCG/ACT nasal spray Place 2 sprays into both nostrils in the morning and at bedtime.   olmesartan  (BENICAR ) 20 MG tablet Take 1 tablet (20 mg total) by mouth daily.   RaNITidine HCl (ACID REDUCER PO) Take 1 tablet by mouth as needed. (Patient not taking: Reported on 08/01/2023)   rosuvastatin  (CRESTOR ) 20 MG tablet Take 1 tablet (20 mg total) by mouth daily.   [DISCONTINUED] metFORMIN  (GLUCOPHAGE ) 500 MG tablet Take 1 tablet (500 mg total) by mouth daily. with food   No facility-administered medications prior to visit.    Review of Systems  Constitutional:  Negative.   HENT: Negative.    Eyes: Negative.   Respiratory: Negative.  Negative for shortness of breath.   Cardiovascular: Negative.  Negative for chest pain.  Gastrointestinal: Negative.  Negative for abdominal pain, constipation and diarrhea.  Genitourinary: Negative.   Musculoskeletal:  Negative for joint pain and myalgias.  Skin: Negative.   Neurological: Negative.  Negative for dizziness and headaches.  Endo/Heme/Allergies: Negative.   All other systems reviewed and are negative.      Objective:   BP 128/70   Pulse 84   Ht 5' 1 (1.549 m)   Wt 143 lb 9.6 oz (65.1 kg)   SpO2 96%   BMI 27.13 kg/m   Vitals:   07/12/24 1346  BP: 128/70  Pulse: 84  Height: 5' 1 (1.549 m)  Weight: 143 lb 9.6 oz (65.1 kg)  SpO2: 96%  BMI (Calculated):  27.15    Physical Exam Vitals and nursing note reviewed.  Constitutional:      Appearance: Normal appearance. She is normal weight.  HENT:     Head: Normocephalic and atraumatic.     Nose: Nose normal.     Mouth/Throat:     Mouth: Mucous membranes are moist.  Eyes:     Extraocular Movements: Extraocular movements intact.     Conjunctiva/sclera: Conjunctivae normal.     Pupils: Pupils are equal, round, and reactive to light.  Cardiovascular:     Rate and Rhythm: Normal rate and regular rhythm.     Pulses: Normal pulses.     Heart sounds: Normal heart sounds.  Pulmonary:     Effort: Pulmonary effort is normal.     Breath sounds: Normal breath sounds.  Abdominal:     General: Abdomen is flat. Bowel sounds are normal.     Palpations: Abdomen is soft.  Musculoskeletal:        General: Normal range of motion.     Cervical back: Normal range of motion.  Skin:    General: Skin is warm and dry.  Neurological:     General: No focal deficit present.     Mental Status: She is alert and oriented to person, place, and time.  Psychiatric:        Mood and Affect: Mood normal.        Behavior: Behavior normal.        Thought Content: Thought content normal.        Judgment: Judgment normal.      No results found for any visits on 07/12/24.  Recent Results (from the past 2160 hours)  Lipid panel     Status: None   Collection Time: 07/05/24  8:57 AM  Result Value Ref Range   Cholesterol, Total 140 100 - 199 mg/dL   Triglycerides 70 0 - 149 mg/dL   HDL 68 >60 mg/dL   VLDL Cholesterol Cal 14 5 - 40 mg/dL   LDL Chol Calc (NIH) 58 0 - 99 mg/dL   Chol/HDL Ratio 2.1 0.0 - 4.4 ratio    Comment:                                   T. Chol/HDL Ratio                                             Men  Women                               1/2 Avg.Risk  3.4    3.3                                   Avg.Risk  5.0    4.4                                2X Avg.Risk  9.6    7.1                                 3X Avg.Risk 23.4   11.0   CMP14+EGFR     Status: Abnormal   Collection Time: 07/05/24  8:57 AM  Result Value Ref Range   Glucose 120 (H) 70 - 99 mg/dL   BUN 12 8 - 27 mg/dL   Creatinine, Ser 9.33 0.57 - 1.00 mg/dL   eGFR 91 >40 fO/fpw/8.26   BUN/Creatinine Ratio 18 12 - 28   Sodium 138 134 - 144 mmol/L   Potassium 5.1 3.5 - 5.2 mmol/L   Chloride 100 96 - 106 mmol/L   CO2 23 20 - 29 mmol/L   Calcium  9.8 8.7 - 10.3 mg/dL   Total Protein 6.9 6.0 - 8.5 g/dL   Albumin 4.3 3.8 - 4.8 g/dL   Globulin, Total 2.6 1.5 - 4.5 g/dL   Bilirubin Total 0.3 0.0 - 1.2 mg/dL   Alkaline Phosphatase 111 44 - 121 IU/L   AST 17 0 - 40 IU/L   ALT 9 0 - 32 IU/L  TSH     Status: None   Collection Time: 07/05/24  8:57 AM  Result Value Ref Range   TSH 0.722 0.450 - 4.500 uIU/mL  Hemoglobin A1c     Status: Abnormal   Collection Time: 07/05/24  8:57 AM  Result Value Ref Range   Hgb A1c MFr Bld 7.2 (H) 4.8 - 5.6 %    Comment:          Prediabetes: 5.7 - 6.4          Diabetes: >6.4          Glycemic control for adults with diabetes: <7.0    Est. average glucose Bld gHb Est-mCnc 160 mg/dL      Assessment & Plan:  Referral sent for colonoscopy Continue same medications  Problem List Items Addressed This Visit       Cardiovascular and Mediastinum   Essential hypertension, benign - Primary     Endocrine   Diabetes mellitus without complication (HCC)   Relevant Medications   metFORMIN  (GLUCOPHAGE ) 500 MG tablet     Other   Mixed hyperlipidemia   Other Visit Diagnoses       Colon cancer screening       Relevant Orders   Ambulatory referral to Gastroenterology       Return in about 6 months (around 01/12/2025) for fasting labs prior.   Total time spent: 25 minutes  Google, NP  07/12/2024   This document may have been prepared by Dragon Voice Recognition software and as such may include unintentional dictation errors.

## 2024-07-12 NOTE — Telephone Encounter (Signed)
 Referral was received from PCP for pt to schedule colonoscopy repeat that was due 2023. Previous was done 2018 by Dr Jinny.

## 2024-07-13 NOTE — Telephone Encounter (Signed)
 Left message on voicemail.

## 2024-07-14 ENCOUNTER — Other Ambulatory Visit: Payer: Self-pay | Admitting: Cardiology

## 2024-08-09 ENCOUNTER — Ambulatory Visit: Payer: Medicare Other | Admitting: Cardiology

## 2024-08-13 NOTE — Telephone Encounter (Signed)
Left message on voicemail  Unable to contact letter mailed

## 2024-10-04 ENCOUNTER — Other Ambulatory Visit: Payer: Self-pay | Admitting: Cardiology

## 2024-10-11 ENCOUNTER — Other Ambulatory Visit: Payer: Self-pay

## 2024-10-11 MED ORDER — OLMESARTAN MEDOXOMIL 20 MG PO TABS: 20.0000 mg | ORAL_TABLET | Freq: Every day | ORAL | 0 refills | Status: DC

## 2024-12-29 ENCOUNTER — Emergency Department
Admission: EM | Admit: 2024-12-29 | Discharge: 2024-12-29 | Disposition: A | Discharge status: Home / Self Care | Attending: Emergency Medicine | Admitting: Emergency Medicine

## 2024-12-29 ENCOUNTER — Other Ambulatory Visit: Payer: Self-pay

## 2024-12-29 ENCOUNTER — Emergency Department

## 2024-12-29 DIAGNOSIS — J101 Influenza due to other identified influenza virus with other respiratory manifestations: Secondary | ICD-10-CM | POA: Diagnosis not present

## 2024-12-29 DIAGNOSIS — I10 Essential (primary) hypertension: Secondary | ICD-10-CM | POA: Diagnosis not present

## 2024-12-29 DIAGNOSIS — J111 Influenza due to unidentified influenza virus with other respiratory manifestations: Secondary | ICD-10-CM

## 2024-12-29 DIAGNOSIS — E119 Type 2 diabetes mellitus without complications: Secondary | ICD-10-CM | POA: Insufficient documentation

## 2024-12-29 DIAGNOSIS — R059 Cough, unspecified: Secondary | ICD-10-CM | POA: Diagnosis present

## 2024-12-29 LAB — RESP PANEL BY RT-PCR (RSV, FLU A&B, COVID)  RVPGX2
Influenza A by PCR: POSITIVE — AB
Influenza B by PCR: NEGATIVE
Resp Syncytial Virus by PCR: NEGATIVE
SARS Coronavirus 2 by RT PCR: NEGATIVE

## 2024-12-29 LAB — BASIC METABOLIC PANEL WITH GFR
Anion gap: 11 (ref 5–15)
BUN: 9 mg/dL (ref 8–23)
CO2: 28 mmol/L (ref 22–32)
Calcium: 9.5 mg/dL (ref 8.9–10.3)
Chloride: 93 mmol/L — ABNORMAL LOW (ref 98–111)
Creatinine, Ser: 0.73 mg/dL (ref 0.44–1.00)
GFR, Estimated: >60 mL/min
Glucose, Bld: 160 mg/dL — ABNORMAL HIGH (ref 70–99)
Potassium: 3.6 mmol/L (ref 3.5–5.1)
Sodium: 132 mmol/L — ABNORMAL LOW (ref 135–145)

## 2024-12-29 LAB — CBC
HCT: 42.2 % (ref 36.0–46.0)
Hemoglobin: 13.8 g/dL (ref 12.0–15.0)
MCH: 28.6 pg (ref 26.0–34.0)
MCHC: 32.7 g/dL (ref 30.0–36.0)
MCV: 87.6 fL (ref 80.0–100.0)
Platelets: 241 K/uL (ref 150–400)
RBC: 4.82 MIL/uL (ref 3.87–5.11)
RDW: 12.8 % (ref 11.5–15.5)
WBC: 6.4 K/uL (ref 4.0–10.5)
nRBC: 0 % (ref 0.0–0.2)

## 2024-12-29 MED ORDER — ACETAMINOPHEN 500 MG PO TABS
1000.0000 mg | ORAL_TABLET | Freq: Once | ORAL | Status: AC
  Administered 2024-12-29: 1000 mg via ORAL
  Filled 2024-12-29: qty 2

## 2024-12-29 MED ORDER — SODIUM CHLORIDE 0.9 % IV BOLUS
500.0000 mL | Freq: Once | INTRAVENOUS | Status: AC
  Administered 2024-12-29: 500 mL via INTRAVENOUS

## 2024-12-29 NOTE — ED Triage Notes (Addendum)
 Pt to ED via [facility] from home. Pt reports chills, high fever, burning in throat, body aches that started on 12/31. Unsure of sick contacts. Denies N/V/D, CP or SOB.

## 2024-12-29 NOTE — ED Provider Notes (Signed)
 "  [facility] Provider Note    Event Date/Time   First MD Initiated Contact with Patient 12/29/24 0820     (approximate)   History   Chills   HPI  Alicia Jennings is a 79 y.o. female with history of diabetes, hypertension who presents with complaints of sore throat, headache, body aches, near syncopal episode this morning.  Symptoms ongoing for 3 days.  Positive cough and some shortness of breath      Physical Exam   Triage Vital Signs: ED Triage Vitals [12/29/24 0744]  Encounter Vitals Group     BP 131/80     Girls Systolic BP Percentile      Girls Diastolic BP Percentile      Boys Systolic BP Percentile      Boys Diastolic BP Percentile      Pulse Rate (!) 105     Resp 20     Temp 99.8 F (37.7 C)     Temp Source Oral     SpO2 98 %     Weight      Height      Head Circumference      Peak Flow      Pain Score 8     Pain Loc      Pain Education      Exclude from Growth Chart     Most recent vital signs: Vitals:   12/29/24 0915 12/29/24 1100  BP: 134/80 128/76  Pulse: 81 78  Resp: 14 14  Temp:    SpO2: 98% 96%     General: Awake, no distress.  CV:  Good peripheral perfusion.  Mild tachycardia Resp:  Normal effort.  Abd:  No distention.  Other:     ED Results / Procedures / Treatments   Labs (all labs ordered are listed, but only abnormal results are displayed) Labs Reviewed  RESP PANEL BY RT-PCR (RSV, FLU A&B, COVID)  RVPGX2 - Abnormal; Notable for the following components:      Result Value   Influenza A by PCR POSITIVE (*)    All other components within normal limits  BASIC METABOLIC PANEL WITH GFR - Abnormal; Notable for the following components:   Sodium 132 (*)    Chloride 93 (*)    Glucose, Bld 160 (*)    All other components within normal limits  CBC     EKG  ED ECG REPORT I, Lamar Price, the attending physician, personally viewed and interpreted this ECG.  Date: 12/29/2024  Rhythm: normal sinus  rhythm QRS Axis: normal Intervals: normal ST/T Wave abnormalities: normal Narrative Interpretation: no evidence of acute ischemia    RADIOLOGY Chest x-ray viewed interpret by me, no pneumonia   PROCEDURES:  Critical Care performed:   Procedures   MEDICATIONS ORDERED IN ED: Medications  sodium chloride  0.9 % bolus 500 mL (0 mLs Intravenous Stopped 12/29/24 1007)  acetaminophen  (TYLENOL ) tablet 1,000 mg (1,000 mg Oral Given 12/29/24 0934)     IMPRESSION / MDM / ASSESSMENT AND PLAN / ED COURSE  I reviewed the triage vital signs and the nursing notes. Patient's presentation is most consistent with acute presentation with potential threat to life or bodily function.  Patient presents with symptoms as above, suspicious for influenza-like illness but concerning given her description of shortness of breath and near syncope.  Differential includes dehydration, pneumonia, viral URI  Will obtain labs, give IV fluids, p.o. Tylenol , obtain chest x-ray, EKG, respiratory panel and reevaluate  Lab work is  overall reassuring, chest x-ray without evidence of pneumonia, EKG is unremarkable.  Influenza a PCR positive  She is feeling much better after treatment, no indication for admission at this time, appropriate discharge with outpatient follow-up      FINAL CLINICAL IMPRESSION(S) / ED DIAGNOSES   Final diagnoses:  Flu     Rx / DC Orders   ED Discharge Orders     None        Note:  This document was prepared using Dragon voice recognition software and may include unintentional dictation errors.   Arlander Charleston, MD 12/29/24 1139  "

## 2025-01-10 ENCOUNTER — Other Ambulatory Visit: Payer: Self-pay

## 2025-01-10 MED ORDER — CITALOPRAM HYDROBROMIDE 20 MG PO TABS: 20.0000 mg | ORAL_TABLET | Freq: Every day | ORAL | 0 refills | Status: AC

## 2025-01-10 MED ORDER — CITALOPRAM HYDROBROMIDE 20 MG PO TABS: 20.0000 mg | ORAL_TABLET | Freq: Every day | ORAL | 3 refills | Status: DC

## 2025-01-15 ENCOUNTER — Other Ambulatory Visit

## 2025-01-15 DIAGNOSIS — E119 Type 2 diabetes mellitus without complications: Secondary | ICD-10-CM

## 2025-01-15 DIAGNOSIS — E782 Mixed hyperlipidemia: Secondary | ICD-10-CM

## 2025-01-15 DIAGNOSIS — I10 Essential (primary) hypertension: Secondary | ICD-10-CM

## 2025-01-16 ENCOUNTER — Ambulatory Visit: Payer: Self-pay | Admitting: Cardiology

## 2025-01-16 LAB — TSH: TSH: 0.64 u[IU]/mL (ref 0.450–4.500)

## 2025-01-16 LAB — CMP14+EGFR
ALT: 12 IU/L (ref 0–32)
AST: 16 IU/L (ref 0–40)
Albumin: 4.3 g/dL (ref 3.8–4.8)
Alkaline Phosphatase: 116 IU/L (ref 49–135)
BUN/Creatinine Ratio: 18 (ref 12–28)
BUN: 10 mg/dL (ref 8–27)
Bilirubin Total: 0.4 mg/dL (ref 0.0–1.2)
CO2: 25 mmol/L (ref 20–29)
Calcium: 9.1 mg/dL (ref 8.7–10.3)
Chloride: 99 mmol/L (ref 96–106)
Creatinine, Ser: 0.56 mg/dL — ABNORMAL LOW (ref 0.57–1.00)
Globulin, Total: 2.6 g/dL (ref 1.5–4.5)
Glucose: 146 mg/dL — ABNORMAL HIGH (ref 70–99)
Potassium: 4.5 mmol/L (ref 3.5–5.2)
Sodium: 136 mmol/L (ref 134–144)
Total Protein: 6.9 g/dL (ref 6.0–8.5)
eGFR: 94 mL/min/1.73

## 2025-01-16 LAB — LIPID PANEL
Chol/HDL Ratio: 2.1 ratio (ref 0.0–4.4)
Cholesterol, Total: 169 mg/dL (ref 100–199)
HDL: 81 mg/dL
LDL Chol Calc (NIH): 72 mg/dL (ref 0–99)
Triglycerides: 89 mg/dL (ref 0–149)
VLDL Cholesterol Cal: 16 mg/dL (ref 5–40)

## 2025-01-16 LAB — HEMOGLOBIN A1C
Est. average glucose Bld gHb Est-mCnc: 183 mg/dL
Hgb A1c MFr Bld: 8 % — ABNORMAL HIGH (ref 4.8–5.6)

## 2025-01-18 ENCOUNTER — Ambulatory Visit: Admitting: Cardiology

## 2025-01-18 ENCOUNTER — Ambulatory Visit: Payer: Self-pay | Admitting: Cardiology

## 2025-01-18 ENCOUNTER — Encounter: Payer: Self-pay | Admitting: Cardiology

## 2025-01-18 VITALS — BP 164/92 | HR 96 | Ht 61.0 in | Wt 143.0 lb

## 2025-01-18 DIAGNOSIS — I152 Hypertension secondary to endocrine disorders: Secondary | ICD-10-CM | POA: Diagnosis not present

## 2025-01-18 DIAGNOSIS — E782 Mixed hyperlipidemia: Secondary | ICD-10-CM

## 2025-01-18 DIAGNOSIS — E1165 Type 2 diabetes mellitus with hyperglycemia: Secondary | ICD-10-CM

## 2025-01-18 DIAGNOSIS — E1169 Type 2 diabetes mellitus with other specified complication: Secondary | ICD-10-CM

## 2025-01-18 DIAGNOSIS — E119 Type 2 diabetes mellitus without complications: Secondary | ICD-10-CM

## 2025-01-18 DIAGNOSIS — E1159 Type 2 diabetes mellitus with other circulatory complications: Secondary | ICD-10-CM | POA: Diagnosis not present

## 2025-01-18 LAB — POCT UA - MICROALBUMIN
Creatinine, POC: 50 mg/dL
Microalbumin Ur, POC: 30 mg/L

## 2025-01-18 MED ORDER — OLMESARTAN MEDOXOMIL 20 MG PO TABS: 20.0000 mg | ORAL_TABLET | Freq: Every day | ORAL | 0 refills | Status: AC

## 2025-01-18 NOTE — Progress Notes (Signed)
 "  Established Patient Office Visit  Subjective:  Patient ID: Alicia Jennings, female    DOB: 02-15-1946  Age: 79 y.o. MRN: [id number]  Chief Complaint  Patient presents with   Follow-up    6 month lab results. Pt. hasn't taken Olmesartan  in 3 months     Patient in office for 6 month follow up, discuss lab results. Patient doing well, no complaints today. Discussed recent lab work. Hgb A1c elevated, discussed increasing metformin  or adding an additional medication. Patient prefers to change her diet. LDL at goal.  Due for colonoscopy, patient declines. Discussed importance of repeat colonoscopy due to polyps being removed previously. Will defer to next visit.   Patient has not taken her blood pressure medication for the past three months, will send in refill today.  Due for urine micro, will do today. Continue current medications.     No other concerns at this time.   Past Medical History:  Diagnosis Date   Anxiety    Diabetes mellitus without complication (HCC)    GERD (gastroesophageal reflux disease)    Heart murmur    Hypertension    Hypoglycemia    Motion sickness    boats    Past Surgical History:  Procedure Laterality Date   COLONOSCOPY WITH PROPOFOL  N/A 05/16/2017   Procedure: COLONOSCOPY WITH PROPOFOL ;  Surgeon: Jinny Carmine, MD;  Location: [facility];  Service: Endoscopy;  Laterality: N/A;  Diabetic - oral meds   POLYPECTOMY N/A 05/16/2017   Procedure: POLYPECTOMY;  Surgeon: Jinny Carmine, MD;  Location: [facility];  Service: Endoscopy;  Laterality: N/A;   TUBAL LIGATION      Social History   Socioeconomic History   Marital status: Divorced    Spouse name: Not on file   Number of children: Not on file   Years of education: Not on file   Highest education level: Not on file  Occupational History   Not on file  Tobacco Use   Smoking status: Former    Current packs/day: 0.00    Types: Cigarettes    Quit date: 2015    Years since quitting:  11.0   Smokeless tobacco: Never  Substance and Sexual Activity   Alcohol use: No    Comment: occasional   Drug use: Not on file   Sexual activity: Not on file  Other Topics Concern   Not on file  Social History Narrative   Not on file   Social Drivers of Health   Tobacco Use: Medium Risk (01/18/2025)   Patient History    Smoking Tobacco Use: Former    Smokeless Tobacco Use: Never    Passive Exposure: Not on Actuary Strain: Not on file  Food Insecurity: Not on file  Transportation Needs: Not on file  Physical Activity: Not on file  Stress: Not on file  Social Connections: Not on file  Intimate Partner Violence: Not on file  Depression (PHQ2-9): Not on file  Alcohol Screen: Not on file  Housing: Not on file  Utilities: Not on file  Health Literacy: Not on file    Family History  Problem Relation Age of Onset   Breast cancer Neg Hx     Allergies[1]  Show/hide medication list[2]  ROS     Objective:   BP (!) 164/92   Pulse 96   Ht 5' 1 (1.549 m)   Wt 143 lb (64.9 kg)   SpO2 97%   BMI 27.02 kg/m   Vitals:   01/18/25  0846  BP: (!) 164/92  Pulse: 96  Height: 5' 1 (1.549 m)  Weight: 143 lb (64.9 kg)  SpO2: 97%  BMI (Calculated): 27.03    Physical Exam   Results for orders placed or performed in visit on 01/18/25  POCT Urine Albumin/Creatinine with ratio [[id number]]  Result Value Ref Range   Microalbumin Ur, POC 30 mg/L   Creatinine, POC 50 mg/dL   Albumin/Creatinine Ratio, Urine, POC 30-300     Recent Results (from the past 2160 hours)  CBC     Status: None   Collection Time: 12/29/24  9:38 AM  Result Value Ref Range   WBC 6.4 4.0 - 10.5 K/uL   RBC 4.82 3.87 - 5.11 MIL/uL   Hemoglobin 13.8 12.0 - 15.0 g/dL   HCT 57.7 63.9 - 53.9 %   MCV 87.6 80.0 - 100.0 fL   MCH 28.6 26.0 - 34.0 pg   MCHC 32.7 30.0 - 36.0 g/dL   RDW 87.1 88.4 - 84.4 %   Platelets 241 150 - 400 K/uL   nRBC 0.0 0.0 - 0.2 %    Comment: Performed at  [facility], [address] [place], [place] [address]  Basic metabolic panel     Status: Abnormal   Collection Time: 12/29/24  9:38 AM  Result Value Ref Range   Sodium 132 (L) 135 - 145 mmol/L   Potassium 3.6 3.5 - 5.1 mmol/L    Comment: HEMOLYSIS AT THIS LEVEL MAY AFFECT RESULT   Chloride 93 (L) 98 - 111 mmol/L   CO2 28 22 - 32 mmol/L   Glucose, Bld 160 (H) 70 - 99 mg/dL    Comment: Glucose reference range applies only to samples taken after fasting for at least 8 hours.   BUN 9 8 - 23 mg/dL   Creatinine, Ser 9.26 0.44 - 1.00 mg/dL   Calcium  9.5 8.9 - 10.3 mg/dL   GFR, Estimated >39 >39 mL/min    Comment: (NOTE) Calculated using the CKD-EPI Creatinine Equation (2021)    Anion gap 11 5 - 15    Comment: Performed at [facility], [address] [place], [place] [address]  Resp panel by RT-PCR (RSV, Flu A&B, Covid) Anterior Nasal Swab     Status: Abnormal   Collection Time: 12/29/24 10:05 AM   Specimen: Anterior Nasal Swab  Result Value Ref Range   SARS Coronavirus 2 by RT PCR NEGATIVE NEGATIVE    Comment: (NOTE) SARS-CoV-2 target nucleic acids are NOT DETECTED.  The SARS-CoV-2 RNA is generally detectable in upper respiratory specimens during the acute phase of infection. The lowest concentration of SARS-CoV-2 viral copies this assay can detect is 138 copies/mL. A negative result does not preclude SARS-Cov-2 infection and should not be used as the sole basis for treatment or other patient management decisions. A negative result may occur with  improper specimen collection/handling, submission of specimen other than nasopharyngeal swab, presence of viral mutation(s) within the areas targeted by this assay, and inadequate number of viral copies(<138 copies/mL). A negative result must be combined with clinical observations, patient history, and epidemiological information. The expected result is Negative.  Fact Sheet for Patients:   bloggercourse.com  Fact Sheet for Healthcare Providers:  seriousbroker.it  This test is no t yet approved or cleared by the [place]  FDA and  has been authorized for detection and/or diagnosis of SARS-CoV-2 by FDA under an Emergency Use Authorization (EUA). This EUA will remain  in effect (meaning this test can be  used) for the duration of the COVID-19 declaration under Section 564(b)(1) of the Act, 21 U.S.C.section 360bbb-3(b)(1), unless the authorization is terminated  or revoked sooner.       Influenza A by PCR POSITIVE (A) NEGATIVE   Influenza B by PCR NEGATIVE NEGATIVE    Comment: (NOTE) The Xpert Xpress SARS-CoV-2/FLU/RSV plus assay is intended as an aid in the diagnosis of influenza from Nasopharyngeal swab specimens and should not be used as a sole basis for treatment. Nasal washings and aspirates are unacceptable for Xpert Xpress SARS-CoV-2/FLU/RSV testing.  Fact Sheet for Patients: bloggercourse.com  Fact Sheet for Healthcare Providers: seriousbroker.it  This test is not yet approved or cleared by the [place]  FDA and has been authorized for detection and/or diagnosis of SARS-CoV-2 by FDA under an Emergency Use Authorization (EUA). This EUA will remain in effect (meaning this test can be used) for the duration of the COVID-19 declaration under Section 564(b)(1) of the Act, 21 U.S.C. section 360bbb-3(b)(1), unless the authorization is terminated or revoked.     Resp Syncytial Virus by PCR NEGATIVE NEGATIVE    Comment: (NOTE) Fact Sheet for Patients: bloggercourse.com  Fact Sheet for Healthcare Providers: seriousbroker.it  This test is not yet approved or cleared by the [place]  FDA and has been authorized for detection and/or diagnosis of SARS-CoV-2 by FDA under an Emergency Use Authorization  (EUA). This EUA will remain in effect (meaning this test can be used) for the duration of the COVID-19 declaration under Section 564(b)(1) of the Act, 21 U.S.C. section 360bbb-3(b)(1), unless the authorization is terminated or revoked.  Performed at [facility], [address] [place], [place] [address]   Hemoglobin A1c     Status: Abnormal   Collection Time: 01/15/25  8:45 AM  Result Value Ref Range   Hgb A1c MFr Bld 8.0 (H) 4.8 - 5.6 %    Comment:          Prediabetes: 5.7 - 6.4          Diabetes: >6.4          Glycemic control for adults with diabetes: <7.0    Est. average glucose Bld gHb Est-mCnc 183 mg/dL  TSH     Status: None   Collection Time: 01/15/25  8:45 AM  Result Value Ref Range   TSH 0.640 0.450 - 4.500 uIU/mL  CMP14+EGFR     Status: Abnormal   Collection Time: 01/15/25  8:45 AM  Result Value Ref Range   Glucose 146 (H) 70 - 99 mg/dL   BUN 10 8 - 27 mg/dL   Creatinine, Ser 9.43 (L) 0.57 - 1.00 mg/dL   eGFR 94 >40 fO/fpw/8.26   BUN/Creatinine Ratio 18 12 - 28   Sodium 136 134 - 144 mmol/L   Potassium 4.5 3.5 - 5.2 mmol/L   Chloride 99 96 - 106 mmol/L   CO2 25 20 - 29 mmol/L   Calcium  9.1 8.7 - 10.3 mg/dL   Total Protein 6.9 6.0 - 8.5 g/dL   Albumin 4.3 3.8 - 4.8 g/dL   Globulin, Total 2.6 1.5 - 4.5 g/dL   Bilirubin Total 0.4 0.0 - 1.2 mg/dL   Alkaline Phosphatase 116 49 - 135 IU/L   AST 16 0 - 40 IU/L   ALT 12 0 - 32 IU/L  Lipid panel     Status: None   Collection Time: 01/15/25  8:45 AM  Result Value Ref Range   Cholesterol, Total 169 100 - 199 mg/dL   Triglycerides  89 0 - 149 mg/dL   HDL 81 >60 mg/dL   VLDL Cholesterol Cal 16 5 - 40 mg/dL   LDL Chol Calc (NIH) 72 0 - 99 mg/dL   Chol/HDL Ratio 2.1 0.0 - 4.4 ratio    Comment:                                   T. Chol/HDL Ratio                                             Men  Women                               1/2 Avg.Risk  3.4    3.3                                   Avg.Risk  5.0     4.4                                2X Avg.Risk  9.6    7.1                                3X Avg.Risk 23.4   11.0   POCT Urine Albumin/Creatinine with ratio [[id number]]     Status: Abnormal   Collection Time: 01/18/25  9:35 AM  Result Value Ref Range   Microalbumin Ur, POC 30 mg/L   Creatinine, POC 50 mg/dL   Albumin/Creatinine Ratio, Urine, POC 30-300       Assessment & Plan:  Work on diet to lower A1c Reconsider colonoscopy Restart olmesartan  Urine micro Continue current medications  Problem List Items Addressed This Visit       Cardiovascular and Mediastinum   Hypertension associated with diabetes (HCC)   Relevant Medications   olmesartan  (BENICAR ) 20 MG tablet     Endocrine   Diabetes mellitus without complication (HCC)   Relevant Medications   olmesartan  (BENICAR ) 20 MG tablet   Other Relevant Orders   POCT Urine Albumin/Creatinine with ratio [[id number]] (Completed)   Combined hyperlipidemia associated with type 2 diabetes mellitus (HCC) - Primary   Relevant Medications   olmesartan  (BENICAR ) 20 MG tablet   Type 2 diabetes mellitus with hyperglycemia, without long-term current use of insulin (HCC)   Relevant Medications   olmesartan  (BENICAR ) 20 MG tablet    Return in about 4 months (around 05/18/2025) for fasting lab work prior.   Total time spent: 25 minutes. This time includes review of previous notes and results and patient face to face interaction during today's visit.    Jeoffrey Pollen, NP  01/18/2025   This document may have been prepared by [facility] Voice Recognition software and as such may include unintentional dictation errors.      [1]  Allergies Allergen Reactions   Aspirin Rash  [2]  Outpatient Medications Prior to Visit  Medication Sig   amLODipine  (NORVASC ) 5 MG tablet Take 1 tablet (5 mg total) by mouth every morning.   citalopram  (CELEXA ) 20 MG tablet Take 1 tablet (20  mg total) by mouth daily.   metFORMIN  (GLUCOPHAGE ) 500 MG tablet  Take 1 tablet (500 mg total) by mouth daily. with food   rosuvastatin  (CRESTOR ) 20 MG tablet TAKE 1 TABLET(20 MG) BY MOUTH DAILY   [DISCONTINUED] olmesartan  (BENICAR ) 20 MG tablet Take 1 tablet (20 mg total) by mouth daily.   cyclobenzaprine (FLEXERIL) 5 MG tablet TAKE 1 TABLET BY MOUTH EVERY NIGHT AT BEDTIME FOR NECK SPASMS (Patient not taking: Reported on 01/18/2025)   fluticasone  (FLONASE ) 50 MCG/ACT nasal spray Place 2 sprays into both nostrils in the morning and at bedtime. (Patient not taking: Reported on 01/18/2025)   RaNITidine HCl (ACID REDUCER PO) Take 1 tablet by mouth as needed. (Patient not taking: Reported on 01/18/2025)   No facility-administered medications prior to visit.   "

## 2025-05-17 ENCOUNTER — Ambulatory Visit: Admitting: Internal Medicine
